# Patient Record
Sex: Female | Born: 1950 | Race: White | Hispanic: No | Marital: Married | Smoking: Never smoker
Health system: Southern US, Community
[De-identification: ages and names within clinical notes are randomized; demographics above are authoritative.]

## PROBLEM LIST (undated history)

## (undated) DIAGNOSIS — E119 Type 2 diabetes mellitus without complications: Secondary | ICD-10-CM

## (undated) DIAGNOSIS — M8580 Other specified disorders of bone density and structure, unspecified site: Secondary | ICD-10-CM

## (undated) DIAGNOSIS — M353 Polymyalgia rheumatica: Secondary | ICD-10-CM

## (undated) DIAGNOSIS — E7800 Pure hypercholesterolemia, unspecified: Secondary | ICD-10-CM

## (undated) DIAGNOSIS — I00 Rheumatic fever without heart involvement: Secondary | ICD-10-CM

## (undated) DIAGNOSIS — M1990 Unspecified osteoarthritis, unspecified site: Secondary | ICD-10-CM

## (undated) HISTORY — PX: ABDOMINAL HYSTERECTOMY: SHX81

## (undated) HISTORY — PX: ARTHROSCOPY KNEE W/ DRILLING: SUR92

---

## 2004-07-21 ENCOUNTER — Ambulatory Visit: Payer: Self-pay | Admitting: Obstetrics and Gynecology

## 2005-08-24 ENCOUNTER — Ambulatory Visit: Payer: Self-pay | Admitting: Obstetrics and Gynecology

## 2006-08-27 ENCOUNTER — Ambulatory Visit: Payer: Self-pay | Admitting: Obstetrics and Gynecology

## 2007-08-12 ENCOUNTER — Ambulatory Visit: Payer: Self-pay | Admitting: Family Medicine

## 2007-10-15 ENCOUNTER — Ambulatory Visit: Payer: Self-pay | Admitting: Obstetrics and Gynecology

## 2008-10-15 ENCOUNTER — Ambulatory Visit: Payer: Self-pay | Admitting: Obstetrics and Gynecology

## 2009-03-22 ENCOUNTER — Ambulatory Visit: Payer: Self-pay | Admitting: Gastroenterology

## 2009-10-19 ENCOUNTER — Ambulatory Visit: Payer: Self-pay | Admitting: Obstetrics and Gynecology

## 2010-10-27 ENCOUNTER — Ambulatory Visit: Payer: Self-pay | Admitting: Obstetrics and Gynecology

## 2011-11-02 ENCOUNTER — Ambulatory Visit: Payer: Self-pay | Admitting: Obstetrics and Gynecology

## 2012-02-06 ENCOUNTER — Emergency Department: Payer: Self-pay | Admitting: Emergency Medicine

## 2012-02-06 LAB — URINALYSIS, COMPLETE
Bacteria: NONE SEEN
Bilirubin,UR: NEGATIVE
Ph: 5 (ref 4.5–8.0)
Protein: NEGATIVE
RBC,UR: <1 /HPF (ref 0–5)
Specific Gravity: 1.01 (ref 1.003–1.030)
Squamous Epithelial: 1

## 2012-02-06 LAB — CBC
MCHC: 31.5 g/dL — ABNORMAL LOW (ref 32.0–36.0)
Platelet: 236 10*3/uL (ref 150–440)
RBC: 3.98 10*6/uL (ref 3.80–5.20)
RDW: 12 % (ref 11.5–14.5)
WBC: 8.2 10*3/uL (ref 3.6–11.0)

## 2012-02-06 LAB — COMPREHENSIVE METABOLIC PANEL
Albumin: 3.2 g/dL — ABNORMAL LOW (ref 3.4–5.0)
Alkaline Phosphatase: 72 U/L (ref 50–136)
Anion Gap: 9 (ref 7–16)
BUN: 18 mg/dL (ref 7–18)
Bilirubin,Total: 0.3 mg/dL (ref 0.2–1.0)
Calcium, Total: 9.2 mg/dL (ref 8.5–10.1)
Chloride: 105 mmol/L (ref 98–107)
Glucose: 196 mg/dL — ABNORMAL HIGH (ref 65–99)
Osmolality: 287 (ref 275–301)
Potassium: 4.1 mmol/L (ref 3.5–5.1)
SGOT(AST): 19 U/L (ref 15–37)
SGPT (ALT): 27 U/L
Total Protein: 6.9 g/dL (ref 6.4–8.2)

## 2012-02-09 LAB — URINE CULTURE

## 2012-11-14 ENCOUNTER — Ambulatory Visit: Payer: Self-pay | Admitting: Obstetrics and Gynecology

## 2013-12-02 ENCOUNTER — Ambulatory Visit: Payer: Self-pay | Admitting: Obstetrics and Gynecology

## 2014-11-24 ENCOUNTER — Other Ambulatory Visit: Payer: Self-pay | Admitting: Obstetrics and Gynecology

## 2014-11-24 DIAGNOSIS — Z1231 Encounter for screening mammogram for malignant neoplasm of breast: Secondary | ICD-10-CM

## 2014-12-11 ENCOUNTER — Ambulatory Visit
Admission: RE | Admit: 2014-12-11 | Discharge: 2014-12-11 | Disposition: A | Discharge status: Home / Self Care | Payer: BC Managed Care – PPO | Source: Ambulatory Visit | Attending: Obstetrics and Gynecology | Admitting: Obstetrics and Gynecology

## 2014-12-11 ENCOUNTER — Other Ambulatory Visit: Payer: Self-pay | Admitting: Obstetrics and Gynecology

## 2014-12-11 DIAGNOSIS — Z1231 Encounter for screening mammogram for malignant neoplasm of breast: Secondary | ICD-10-CM | POA: Diagnosis present

## 2015-11-23 ENCOUNTER — Other Ambulatory Visit: Payer: Self-pay | Admitting: Obstetrics and Gynecology

## 2015-11-23 DIAGNOSIS — Z1231 Encounter for screening mammogram for malignant neoplasm of breast: Secondary | ICD-10-CM

## 2015-12-13 ENCOUNTER — Other Ambulatory Visit: Payer: Self-pay | Admitting: Obstetrics and Gynecology

## 2015-12-13 ENCOUNTER — Ambulatory Visit
Admission: RE | Admit: 2015-12-13 | Discharge: 2015-12-13 | Disposition: A | Discharge status: Home / Self Care | Payer: BC Managed Care – PPO | Source: Ambulatory Visit | Attending: Obstetrics and Gynecology | Admitting: Obstetrics and Gynecology

## 2015-12-13 DIAGNOSIS — Z1231 Encounter for screening mammogram for malignant neoplasm of breast: Secondary | ICD-10-CM

## 2016-03-01 ENCOUNTER — Encounter: Admission: RE | Payer: Self-pay | Source: Ambulatory Visit

## 2016-03-01 ENCOUNTER — Ambulatory Visit
Admission: RE | Admit: 2016-03-01 | Payer: BC Managed Care – PPO | Source: Ambulatory Visit | Admitting: Unknown Physician Specialty

## 2016-03-01 SURGERY — COLONOSCOPY WITH PROPOFOL
Anesthesia: General

## 2016-04-21 ENCOUNTER — Encounter: Payer: Self-pay | Admitting: *Deleted

## 2016-04-24 ENCOUNTER — Ambulatory Visit: Payer: BC Managed Care – PPO | Admitting: Anesthesiology

## 2016-04-24 ENCOUNTER — Encounter
Admission: RE | Disposition: A | Discharge status: Home / Self Care | Payer: Self-pay | Source: Ambulatory Visit | Attending: Unknown Physician Specialty

## 2016-04-24 ENCOUNTER — Encounter: Payer: Self-pay | Admitting: *Deleted

## 2016-04-24 ENCOUNTER — Ambulatory Visit: Admit: 2016-04-24 | Payer: Medicare Other | Admitting: Unknown Physician Specialty

## 2016-04-24 ENCOUNTER — Ambulatory Visit
Admission: RE | Admit: 2016-04-24 | Discharge: 2016-04-24 | Disposition: A | Discharge status: Home / Self Care | Payer: BC Managed Care – PPO | Source: Ambulatory Visit | Attending: Unknown Physician Specialty | Admitting: Unknown Physician Specialty

## 2016-04-24 DIAGNOSIS — M1990 Unspecified osteoarthritis, unspecified site: Secondary | ICD-10-CM | POA: Insufficient documentation

## 2016-04-24 DIAGNOSIS — Z1211 Encounter for screening for malignant neoplasm of colon: Secondary | ICD-10-CM | POA: Diagnosis present

## 2016-04-24 DIAGNOSIS — Z7982 Long term (current) use of aspirin: Secondary | ICD-10-CM | POA: Diagnosis not present

## 2016-04-24 DIAGNOSIS — Z79899 Other long term (current) drug therapy: Secondary | ICD-10-CM | POA: Insufficient documentation

## 2016-04-24 DIAGNOSIS — Z7984 Long term (current) use of oral hypoglycemic drugs: Secondary | ICD-10-CM | POA: Diagnosis not present

## 2016-04-24 DIAGNOSIS — K640 First degree hemorrhoids: Secondary | ICD-10-CM | POA: Insufficient documentation

## 2016-04-24 DIAGNOSIS — E119 Type 2 diabetes mellitus without complications: Secondary | ICD-10-CM | POA: Insufficient documentation

## 2016-04-24 DIAGNOSIS — Z79890 Hormone replacement therapy (postmenopausal): Secondary | ICD-10-CM | POA: Diagnosis not present

## 2016-04-24 HISTORY — DX: Other specified disorders of bone density and structure, unspecified site: M85.80

## 2016-04-24 HISTORY — DX: Rheumatic fever without heart involvement: I00

## 2016-04-24 HISTORY — DX: Polymyalgia rheumatica: M35.3

## 2016-04-24 HISTORY — DX: Unspecified osteoarthritis, unspecified site: M19.90

## 2016-04-24 HISTORY — PX: COLONOSCOPY WITH PROPOFOL: SHX5780

## 2016-04-24 HISTORY — DX: Pure hypercholesterolemia, unspecified: E78.00

## 2016-04-24 HISTORY — DX: Type 2 diabetes mellitus without complications: E11.9

## 2016-04-24 LAB — GLUCOSE, CAPILLARY
GLUCOSE-CAPILLARY: 75 mg/dL (ref 65–99)
Glucose-Capillary: 75 mg/dL (ref 65–99)

## 2016-04-24 SURGERY — COLONOSCOPY WITH PROPOFOL
Anesthesia: General

## 2016-04-24 MED ORDER — SODIUM CHLORIDE 0.9 % IV SOLN: INTRAVENOUS | Status: DC

## 2016-04-24 MED ORDER — POLYETHYLENE GLYCOL 3350 17 GM/SCOOP PO POWD
1.0000 | Freq: Once | ORAL | Status: AC
  Administered 2016-04-24: 255 g via ORAL
  Filled 2016-04-24: qty 255

## 2016-04-24 MED ORDER — PROPOFOL 500 MG/50ML IV EMUL
INTRAVENOUS | Status: DC | PRN
  Administered 2016-04-24: 150 ug/kg/min via INTRAVENOUS

## 2016-04-24 MED ORDER — PHENYLEPHRINE HCL 10 MG/ML IJ SOLN
INTRAMUSCULAR | Status: DC | PRN
  Administered 2016-04-24: 100 ug via INTRAVENOUS

## 2016-04-24 MED ORDER — PROPOFOL 10 MG/ML IV BOLUS
INTRAVENOUS | Status: DC | PRN
  Administered 2016-04-24 (×2): 20 mg via INTRAVENOUS

## 2016-04-24 MED ORDER — EPHEDRINE SULFATE-NACL 50-0.9 MG/10ML-% IV SOSY
PREFILLED_SYRINGE | INTRAVENOUS | Status: DC | PRN
  Administered 2016-04-24: 10 mg via INTRAVENOUS

## 2016-04-24 MED ORDER — PROPOFOL 10 MG/ML IV BOLUS
INTRAVENOUS | Status: DC | PRN
  Administered 2016-04-24: 90 mg via INTRAVENOUS

## 2016-04-24 MED ORDER — PROPOFOL 500 MG/50ML IV EMUL
INTRAVENOUS | Status: DC | PRN
  Administered 2016-04-24: 100 ug/kg/min via INTRAVENOUS

## 2016-04-24 MED ORDER — DEXTROSE 5 % IV SOLN
INTRAVENOUS | Status: DC
  Administered 2016-04-24 (×3): via INTRAVENOUS

## 2016-04-24 MED ORDER — SODIUM CHLORIDE 0.9 % IV SOLN
INTRAVENOUS | Status: DC
  Administered 2016-04-24: 1000 mL via INTRAVENOUS

## 2016-04-24 NOTE — Op Note (Signed)
[facility] Gastroenterology Patient Name: Ann Castro Procedure Date: 04/24/2016 7:35 AM MRN: [id number] Account #: [id number] Date of Birth: [birth year] Admit Type: Outpatient Age: 65 Room: [facility] ENDO ROOM 4 Gender: Female Note Status: Finalized Procedure:            Colonoscopy Indications:          Screening for colorectal malignant neoplasm Providers:            Scot Jun, MD Referring MD:         Teena Irani. Terance Hart, MD (Referring MD) Medicines:            Propofol per Anesthesia Complications:        No immediate complications. Procedure:            Pre-Anesthesia Assessment:                       - After reviewing the risks and benefits, the patient                        was deemed in satisfactory condition to undergo the                        procedure.                       After obtaining informed consent, the colonoscope was                        passed under direct vision. Throughout the procedure,                        the patient's blood pressure, pulse, and oxygen                        saturations were monitored continuously. The                        Colonoscope was introduced through the anus with the                        intention of advancing to the cecum. The scope was                        advanced to the descending colon before the procedure                        was aborted. Medications were given. The colonoscopy                        was performed with difficulty due to inadequate bowel                        prep. The patient tolerated the procedure well. The                        quality of the bowel preparation was unsatisfactory. Findings:      A large amount of semi-liquid stool was found in the rectum and in the       sigmoid colon, precluding visualization.      Internal hemorrhoids were found during endoscopy. The hemorrhoids  were       small and Grade I (internal hemorrhoids that do not prolapse).      The  exam was otherwise without abnormality. Impression:           - Preparation of the colon was unsatisfactory.                       - Stool in the rectum and in the sigmoid colon.                       - Internal hemorrhoids.                       - The examination was otherwise normal.                       - No specimens collected. Recommendation:       Possible more prep today or reschedule to a later date.                       - The findings and recommendations were discussed with                        the patient's family. Scot Junobert T Akshaj Besancon, MD 04/24/2016 7:50:58 AM This report has been signed electronically. Number of Addenda: 0 Note Initiated On: 04/24/2016 7:35 AM Total Procedure Duration: 0 hours 6 minutes 26 seconds       [facility]

## 2016-04-24 NOTE — H&P (Signed)
Primary Care Physician:  Dorothey Baseman, MD Primary Gastroenterologist:  Dr. Mechele Collin  Pre-Procedure History & Physical: HPI:  Ann Castro is a 65 y.o. female is here for an colonoscopy.   Past Medical History:  Diagnosis Date  . Arthritis   . Diabetes mellitus without complication (HCC)   . Elevated cholesterol   . Osteopenia   . Polymyalgia rheumatica (HCC)   . Rheumatic fever     Past Surgical History:  Procedure Laterality Date  . ABDOMINAL HYSTERECTOMY    . ARTHROSCOPY KNEE W/ DRILLING Left     Prior to Admission medications   Medication Sig Start Date End Date Taking? Authorizing Provider  amLODipine (NORVASC) 5 MG tablet Take 5 mg by mouth daily.   Yes Historical Provider, MD  aspirin EC 81 MG tablet Take 81 mg by mouth daily.   Yes Historical Provider, MD  chlorthalidone (HYGROTON) 25 MG tablet Take 25 mg by mouth daily.   Yes Historical Provider, MD  estrogens, conjugated, (PREMARIN) 0.3 MG tablet Take 0.3 mg by mouth daily. Take daily for 21 days then do not take for 7 days.   Yes Historical Provider, MD  glucose blood test strip 2 each by Other route as needed for other. Use as instructed   Yes Historical Provider, MD  Lancet Devices (LANCING DEVICE) MISC 1 Device by Does not apply route 2 (two) times daily.   Yes Historical Provider, MD  latanoprost (XALATAN) 0.005 % ophthalmic solution 1 drop at bedtime.   Yes Historical Provider, MD  metFORMIN (GLUCOPHAGE-XR) 750 MG 24 hr tablet Take 750 mg by mouth daily with breakfast.   Yes Historical Provider, MD  potassium chloride SA (K-DUR,KLOR-CON) 20 MEQ tablet Take 20 mEq by mouth 2 (two) times daily.   Yes Historical Provider, MD  solifenacin (VESICARE) 10 MG tablet Take 10 mg by mouth daily.   Yes Historical Provider, MD  timolol (TIMOPTIC) 0.5 % ophthalmic solution 1 drop 2 (two) times daily.   Yes Historical Provider, MD    Allergies as of 03/03/2016  . (Not on File)    Family History  Problem Relation Age  of Onset  . Breast cancer Maternal Aunt 69    Social History   Social History  . Marital status: Married    Spouse name: N/A  . Number of children: N/A  . Years of education: N/A   Occupational History  . Not on file.   Social History Main Topics  . Smoking status: Never Smoker  . Smokeless tobacco: Never Used  . Alcohol use Yes     Comment: occasional  . Drug use: No  . Sexual activity: Not on file   Other Topics Concern  . Not on file   Social History Narrative  . No narrative on file    Review of Systems: See HPI, otherwise negative ROS  Physical Exam: BP 98/63   Pulse 76   Temp 97.4 F (36.3 C) (Tympanic)   Resp 13   Ht 5\' 2"  (1.575 m)   Wt 49.9 kg (110 lb)   SpO2 100%   BMI 20.12 kg/m  General:   Alert,  pleasant and cooperative in NAD Head:  Normocephalic and atraumatic. Neck:  Supple; no masses or thyromegaly. Lungs:  Clear throughout to auscultation.    Heart:  Regular rate and rhythm. Abdomen:  Soft, nontender and nondistended. Normal bowel sounds, without guarding, and without rebound.   Neurologic:  Alert and  oriented x4;  grossly normal neurologically.  Impression/Plan: Ann Castro is here for an colonoscopy to be performed for screening colonoscopy  Risks, benefits, limitations, and alternatives regarding  colonoscopy have been reviewed with the patient.  Questions have been answered.  All parties agreeable.   Lynnae PrudeELLIOTT, Encarnacion Scioneaux, MD  04/24/2016, 8:28 AM

## 2016-04-24 NOTE — Anesthesia Preprocedure Evaluation (Signed)
Anesthesia Evaluation  Patient identified by MRN, date of birth, ID band Patient awake    Reviewed: Allergy & Precautions, H&P , NPO status , Patient's Chart, lab work & pertinent test results, reviewed documented beta blocker date and time   Airway Mallampati: III   Neck ROM: full    Dental  (+) Poor Dentition, Teeth Intact   Pulmonary neg pulmonary ROS,    Pulmonary exam normal        Cardiovascular negative cardio ROS Normal cardiovascular exam Rhythm:regular Rate:Normal     Neuro/Psych negative neurological ROS  negative psych ROS   GI/Hepatic negative GI ROS, Neg liver ROS,   Endo/Other  negative endocrine ROSdiabetes  Renal/GU negative Renal ROS  negative genitourinary   Musculoskeletal   Abdominal   Peds  Hematology negative hematology ROS (+)   Anesthesia Other Findings Past Medical History: No date: Arthritis No date: Diabetes mellitus without complication (HCC) No date: Elevated cholesterol No date: Osteopenia No date: Polymyalgia rheumatica (HCC) No date: Rheumatic fever Past Surgical History: No date: ABDOMINAL HYSTERECTOMY No date: ARTHROSCOPY KNEE W/ DRILLING Left   Reproductive/Obstetrics                             Anesthesia Physical Anesthesia Plan  ASA: III  Anesthesia Plan: General   Post-op Pain Management:    Induction:   Airway Management Planned:   Additional Equipment:   Intra-op Plan:   Post-operative Plan:   Informed Consent: I have reviewed the patients History and Physical, chart, labs and discussed the procedure including the risks, benefits and alternatives for the proposed anesthesia with the patient or authorized representative who has indicated his/her understanding and acceptance.   Dental Advisory Given  Plan Discussed with: CRNA  Anesthesia Plan Comments:         Anesthesia Quick Evaluation

## 2016-04-24 NOTE — OR Nursing (Signed)
Patient admitted to postop room 12 per stretcher. See vital sign flow sheet.  Dr.Elliott ordered an additional prep to further cleanout the patient for repeat colonoscopy later today.  Patient to remain in postop until continuation of bowel prep.

## 2016-04-24 NOTE — OR Nursing (Signed)
Patient has completed prep.  Dr. Mechele CollinElliott in to see patient. Patient up to restroom prn.  Tolerated prep well. Stool reusults is liquid but still dark in color.

## 2016-04-24 NOTE — Op Note (Signed)
[facility] Gastroenterology Patient Name: Ann Castro Procedure Date: 04/24/2016 4:44 PM MRN: [id number] Account #: [id number] Date of Birth: 22-May-1951 Admit Type: Outpatient Age: 66 Room: [facility] ENDO ROOM 4 Gender: Female Note Status: Finalized Procedure:            Colonoscopy Indications:          Screening for colorectal malignant neoplasm Providers:            Scot Jun, MD Referring MD:         Teena Irani. Terance Hart, MD (Referring MD) Medicines:            Propofol per Anesthesia Complications:        No immediate complications. Procedure:            Pre-Anesthesia Assessment:                       - After reviewing the risks and benefits, the patient                        was deemed in satisfactory condition to undergo the                        procedure.                       After obtaining informed consent, the colonoscope was                        passed under direct vision. Throughout the procedure,                        the patient's blood pressure, pulse, and oxygen                        saturations were monitored continuously. The                        Colonoscope was introduced through the anus and                        advanced to the the cecum, identified by appendiceal                        orifice and ileocecal valve. The colonoscopy was                        performed without difficulty. The patient tolerated the                        procedure well. The quality of the bowel preparation                        was good. Findings:      Internal hemorrhoids were found during endoscopy. The hemorrhoids were       small and Grade I (internal hemorrhoids that do not prolapse).      The exam was otherwise without abnormality. The prep was much better       after the 2L extra bottle of Miralax and Gatorade. Impression:           - Internal hemorrhoids.                       -  The examination was otherwise normal.     - No specimens collected. Recommendation:       - Repeat colonoscopy in 10 years for screening purposes. Scot Junobert T Jerrica Thorman, MD 04/24/2016 5:24:31 PM This report has been signed electronically. Number of Addenda: 0 Note Initiated On: 04/24/2016 4:44 PM Scope Withdrawal Time: 0 hours 13 minutes 49 seconds  Total Procedure Duration: 0 hours 24 minutes 57 seconds       [facility]

## 2016-04-24 NOTE — Transfer of Care (Signed)
Immediate Anesthesia Transfer of Care Note  Patient: Ann Castro  Procedure(s) Performed: Procedure(s): COLONOSCOPY WITH PROPOFOL (N/A)  Patient Location: PACU and Endoscopy Unit  Anesthesia Type:General  Level of Consciousness: awake  Airway & Oxygen Therapy: Patient Spontanous Breathing  Post-op Assessment: Report given to RN  Post vital signs: stable  Last Vitals:  Vitals:   04/24/16 0704  BP: 114/66  Pulse: 93  Resp: 16  Temp: 36.2 C    Last Pain:  Vitals:   04/24/16 0704  TempSrc: Tympanic         Complications: No apparent anesthesia complications

## 2016-04-24 NOTE — Transfer of Care (Signed)
Immediate Anesthesia Transfer of Care Note  Patient: Reinaldo BerberBetsy E Castro  Procedure(s) Performed: Procedure(s): COLONOSCOPY WITH PROPOFOL (N/A)  Patient Location: PACU and Endoscopy Unit  Anesthesia Type:General  Level of Consciousness: patient cooperative and lethargic  Airway & Oxygen Therapy: Patient Spontanous Breathing and Patient connected to nasal cannula oxygen  Post-op Assessment: Report given to RN and Post -op Vital signs reviewed and stable  Post vital signs: Reviewed and stable  Last Vitals:  Vitals:   04/24/16 0815 04/24/16 1725  BP: 98/63 97/66  Pulse: 76 76  Resp: 13   Temp:      Last Pain:  Vitals:   04/24/16 0755  TempSrc: Tympanic  PainSc: Asleep         Complications: No apparent anesthesia complications

## 2016-04-24 NOTE — Anesthesia Preprocedure Evaluation (Signed)
Anesthesia Evaluation  Patient identified by MRN, date of birth, ID band Patient awake    Reviewed: Allergy & Precautions, H&P , NPO status , Patient's Chart, lab work & pertinent test results, reviewed documented beta blocker date and time   Airway Mallampati: III   Neck ROM: full    Dental  (+) Poor Dentition, Teeth Intact   Pulmonary neg pulmonary ROS,    Pulmonary exam normal        Cardiovascular negative cardio ROS Normal cardiovascular exam Rhythm:regular Rate:Normal     Neuro/Psych negative neurological ROS  negative psych ROS   GI/Hepatic negative GI ROS, Neg liver ROS,   Endo/Other  negative endocrine ROSdiabetes  Renal/GU negative Renal ROS  negative genitourinary   Musculoskeletal   Abdominal   Peds  Hematology negative hematology ROS (+)   Anesthesia Other Findings Past Medical History: No date: Arthritis No date: Diabetes mellitus without complication (HCC) No date: Elevated cholesterol No date: Osteopenia No date: Polymyalgia rheumatica (HCC) No date: Rheumatic fever Past Surgical History: No date: ABDOMINAL HYSTERECTOMY No date: ARTHROSCOPY KNEE W/ DRILLING Left   Reproductive/Obstetrics                             Anesthesia Physical Anesthesia Plan  ASA: III  Anesthesia Plan: General   Post-op Pain Management:    Induction:   Airway Management Planned:   Additional Equipment:   Intra-op Plan:   Post-operative Plan:   Informed Consent: I have reviewed the patients History and Physical, chart, labs and discussed the procedure including the risks, benefits and alternatives for the proposed anesthesia with the patient or authorized representative who has indicated his/her understanding and acceptance.   Dental Advisory Given  Plan Discussed with: CRNA  Anesthesia Plan Comments:         Anesthesia Quick Evaluation  

## 2016-04-24 NOTE — Anesthesia Postprocedure Evaluation (Signed)
Anesthesia Post Note  Patient: Reinaldo BerberBetsy E Volland  Procedure(s) Performed: Procedure(s) (LRB): COLONOSCOPY WITH PROPOFOL (N/A)  Patient location during evaluation: PACU Anesthesia Type: General Level of consciousness: awake and alert Pain management: pain level controlled Vital Signs Assessment: post-procedure vital signs reviewed and stable Respiratory status: spontaneous breathing, nonlabored ventilation, respiratory function stable and patient connected to nasal cannula oxygen Cardiovascular status: blood pressure returned to baseline and stable Postop Assessment: no signs of nausea or vomiting Anesthetic complications: no    Last Vitals:  Vitals:   04/24/16 0805 04/24/16 0815  BP: (!) 96/58 98/63  Pulse: 73 76  Resp: 14 13  Temp:      Last Pain:  Vitals:   04/24/16 0755  TempSrc: Tympanic  PainSc: Asleep                 Yevette EdwardsJames G Sameera Betton

## 2016-04-25 ENCOUNTER — Encounter: Payer: Self-pay | Admitting: Unknown Physician Specialty

## 2016-04-25 IMAGING — MG MM SCREENING BREAST TOMO BILATERAL
9 of 12 series · 9 of 28 positions shown · non-contrast
Comparison: Previous exam(s).

CLINICAL DATA: Screening.

EXAM:
DIGITAL SCREENING BILATERAL MAMMOGRAM WITH 3D TOMO WITH CAD

[R MLO synth-2D]
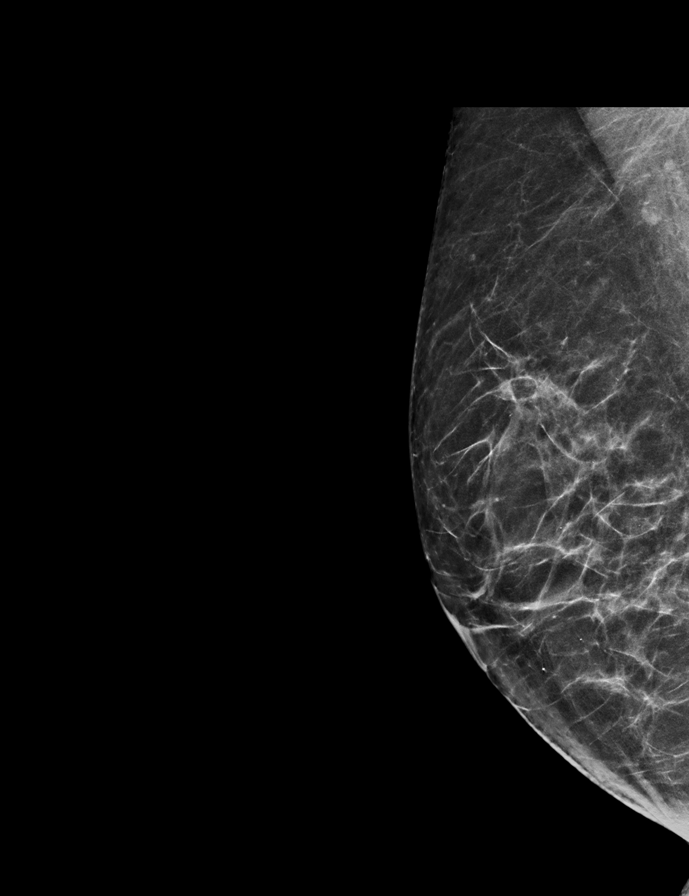

[L MLO synth-2D]
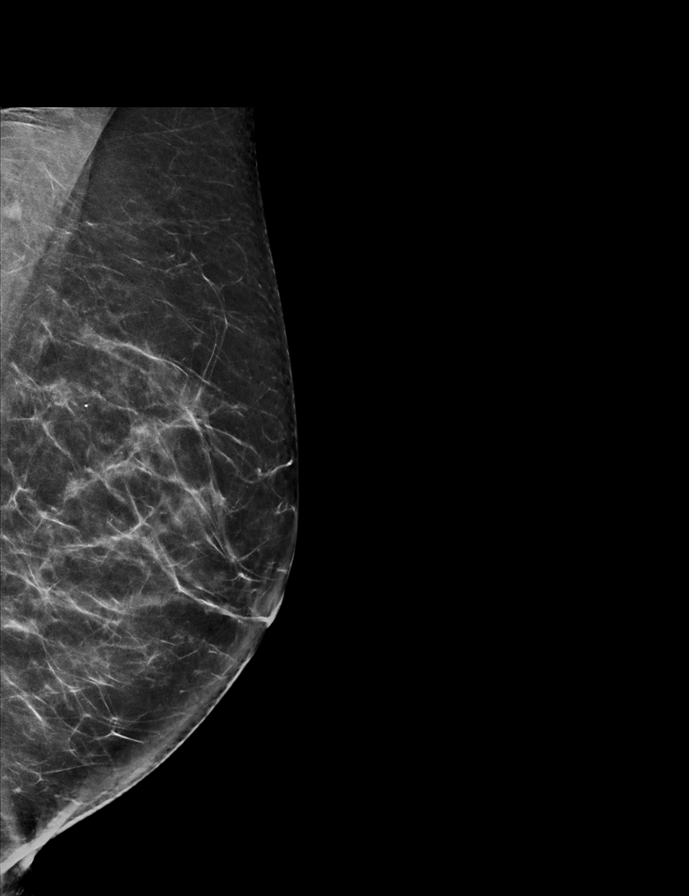

[L CC]
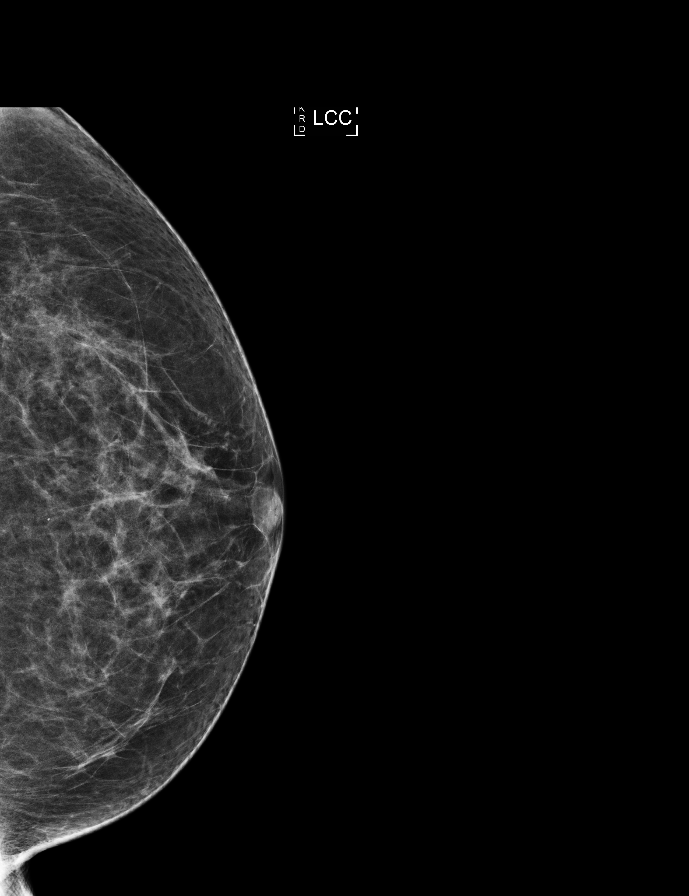

[R CC]
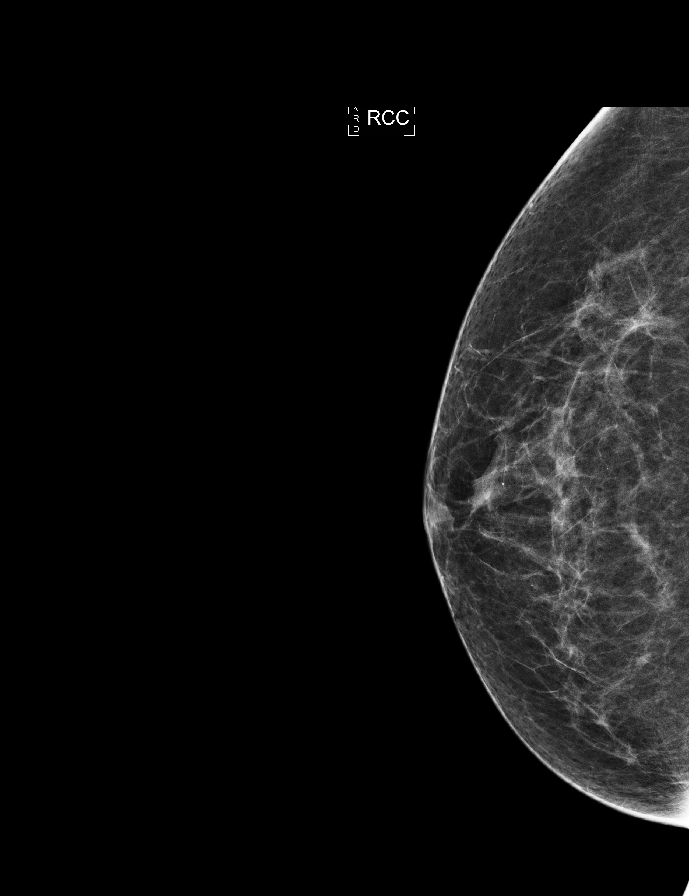

[R CC synth-2D]
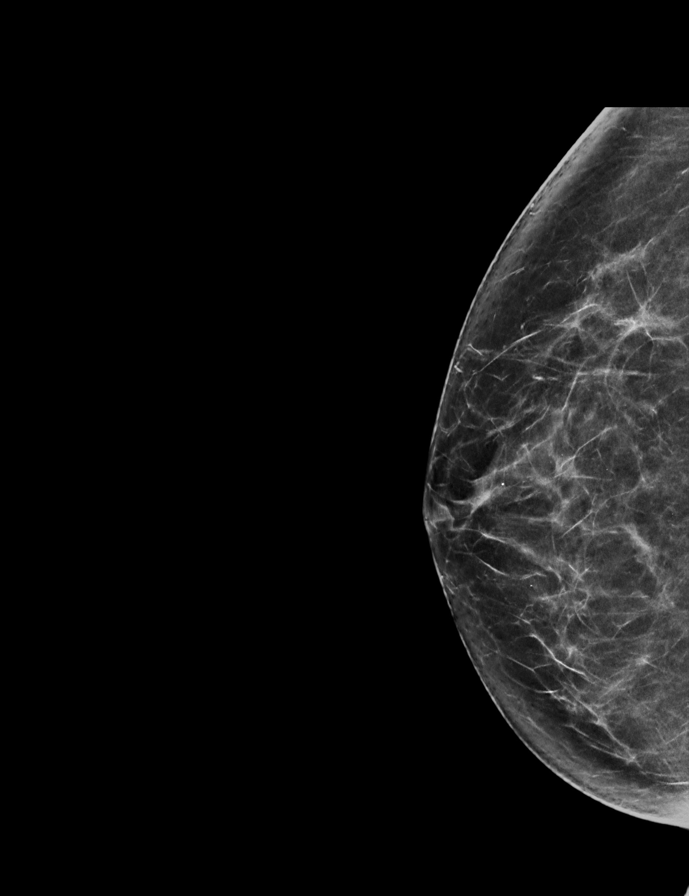

[L CC synth-2D]
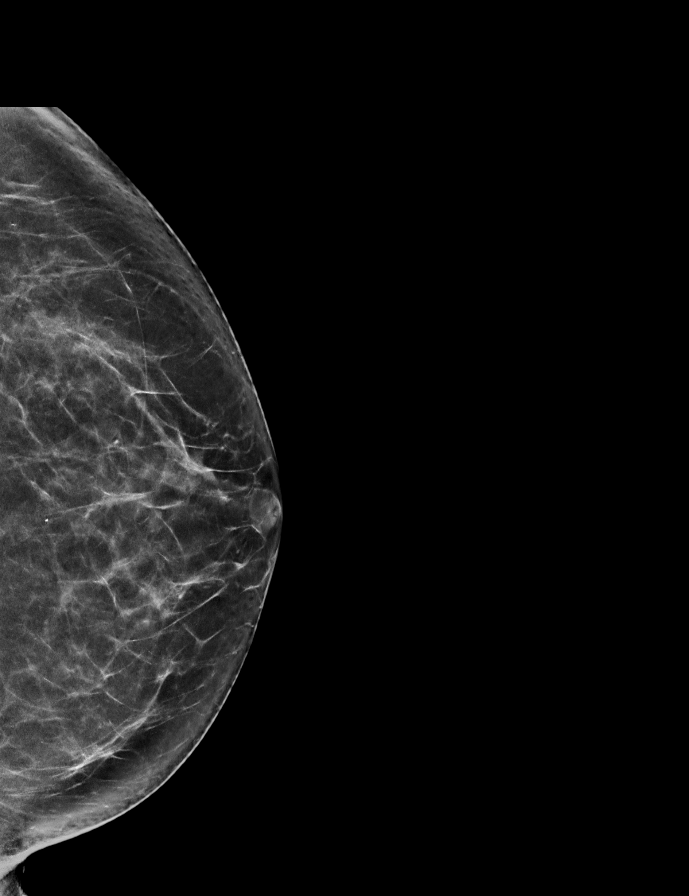

[R MLO]
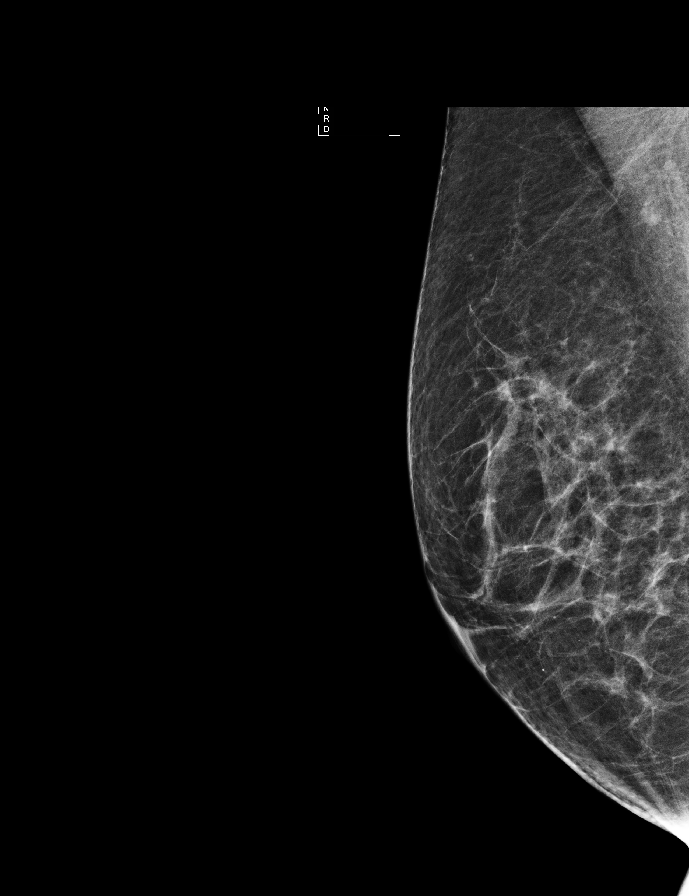

[L MLO]
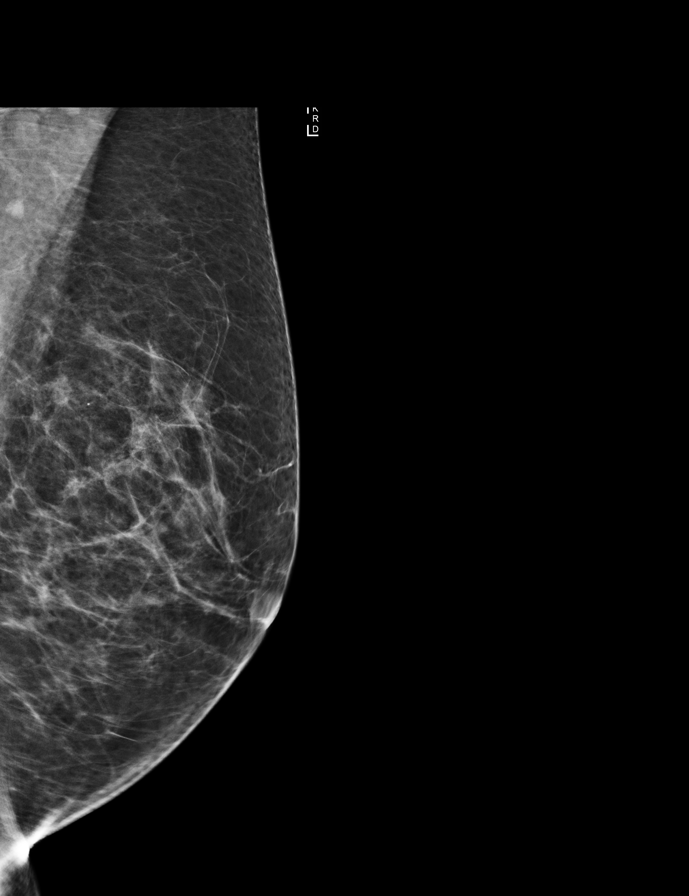

[R CC tomo · tomo slice 37/73.0]
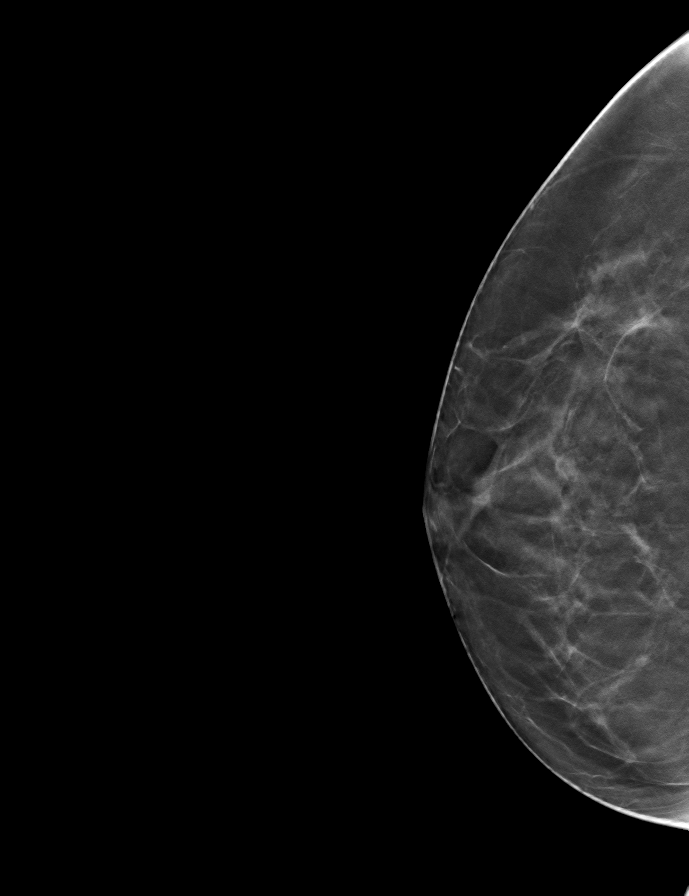

[9 of 28 positions shown; findings below may reference images not displayed]

ACR Breast Density Category b: There are scattered areas of
fibroglandular density.
FINDINGS: There are no findings suspicious for malignancy. Images were
processed with CAD.
IMPRESSION: No mammographic evidence of malignancy. A result letter of this
screening mammogram will be mailed directly to the patient.

RECOMMENDATION:
Screening mammogram in one year. (Code:55-L-23V)

BI-RADS CATEGORY  1: Negative.

## 2016-04-25 NOTE — Anesthesia Postprocedure Evaluation (Signed)
Anesthesia Post Note  Patient: Ann Castro  Procedure(s) Performed: Procedure(s) (LRB): COLONOSCOPY WITH PROPOFOL (N/A)  Patient location during evaluation: PACU Anesthesia Type: General Level of consciousness: awake and alert and oriented Pain management: pain level controlled Vital Signs Assessment: post-procedure vital signs reviewed and stable Respiratory status: spontaneous breathing Cardiovascular status: blood pressure returned to baseline Anesthetic complications: no    Last Vitals:  Vitals:   04/24/16 1745 04/24/16 1755  BP: 105/70 99/69  Pulse:    Resp:    Temp:      Last Pain:  Vitals:   04/24/16 1725  TempSrc: Tympanic  PainSc:                  Priti Consoli

## 2016-12-13 ENCOUNTER — Other Ambulatory Visit: Payer: Self-pay | Admitting: Obstetrics and Gynecology

## 2016-12-13 DIAGNOSIS — Z1231 Encounter for screening mammogram for malignant neoplasm of breast: Secondary | ICD-10-CM

## 2017-01-05 ENCOUNTER — Ambulatory Visit
Admission: RE | Admit: 2017-01-05 | Discharge: 2017-01-05 | Disposition: A | Discharge status: Home / Self Care | Payer: BC Managed Care – PPO | Source: Ambulatory Visit | Attending: Obstetrics and Gynecology | Admitting: Obstetrics and Gynecology

## 2017-01-05 DIAGNOSIS — Z1231 Encounter for screening mammogram for malignant neoplasm of breast: Secondary | ICD-10-CM | POA: Diagnosis present

## 2017-12-25 ENCOUNTER — Other Ambulatory Visit: Payer: Self-pay | Admitting: Obstetrics and Gynecology

## 2017-12-25 DIAGNOSIS — Z1231 Encounter for screening mammogram for malignant neoplasm of breast: Secondary | ICD-10-CM

## 2018-01-11 ENCOUNTER — Ambulatory Visit
Admission: RE | Admit: 2018-01-11 | Discharge: 2018-01-11 | Disposition: A | Discharge status: Home / Self Care | Payer: Medicare Other | Source: Ambulatory Visit | Attending: Obstetrics and Gynecology | Admitting: Obstetrics and Gynecology

## 2018-01-11 DIAGNOSIS — Z1231 Encounter for screening mammogram for malignant neoplasm of breast: Secondary | ICD-10-CM | POA: Diagnosis present

## 2018-12-31 ENCOUNTER — Other Ambulatory Visit: Payer: Self-pay | Admitting: Obstetrics and Gynecology

## 2018-12-31 DIAGNOSIS — Z1231 Encounter for screening mammogram for malignant neoplasm of breast: Secondary | ICD-10-CM

## 2019-01-14 ENCOUNTER — Other Ambulatory Visit: Payer: Self-pay

## 2019-01-14 ENCOUNTER — Ambulatory Visit
Admission: RE | Admit: 2019-01-14 | Discharge: 2019-01-14 | Disposition: A | Discharge status: Home / Self Care | Payer: Medicare Other | Source: Ambulatory Visit | Attending: Obstetrics and Gynecology | Admitting: Obstetrics and Gynecology

## 2019-01-14 DIAGNOSIS — Z1231 Encounter for screening mammogram for malignant neoplasm of breast: Secondary | ICD-10-CM | POA: Insufficient documentation

## 2020-01-06 ENCOUNTER — Other Ambulatory Visit: Payer: Self-pay | Admitting: Obstetrics and Gynecology

## 2020-01-06 DIAGNOSIS — Z1231 Encounter for screening mammogram for malignant neoplasm of breast: Secondary | ICD-10-CM

## 2020-01-15 ENCOUNTER — Ambulatory Visit
Admission: RE | Admit: 2020-01-15 | Discharge: 2020-01-15 | Disposition: A | Discharge status: Home / Self Care | Payer: Medicare PPO | Source: Ambulatory Visit | Attending: Obstetrics and Gynecology | Admitting: Obstetrics and Gynecology

## 2020-01-15 DIAGNOSIS — Z1231 Encounter for screening mammogram for malignant neoplasm of breast: Secondary | ICD-10-CM | POA: Diagnosis present

## 2021-01-25 ENCOUNTER — Other Ambulatory Visit: Payer: Self-pay | Admitting: Obstetrics and Gynecology

## 2021-01-25 DIAGNOSIS — Z1231 Encounter for screening mammogram for malignant neoplasm of breast: Secondary | ICD-10-CM

## 2021-02-10 ENCOUNTER — Other Ambulatory Visit: Payer: Self-pay

## 2021-02-10 ENCOUNTER — Ambulatory Visit
Admission: RE | Admit: 2021-02-10 | Discharge: 2021-02-10 | Disposition: A | Discharge status: Home / Self Care | Payer: Medicare PPO | Source: Ambulatory Visit | Attending: Obstetrics and Gynecology | Admitting: Obstetrics and Gynecology

## 2021-02-10 DIAGNOSIS — Z1231 Encounter for screening mammogram for malignant neoplasm of breast: Secondary | ICD-10-CM | POA: Diagnosis not present

## 2022-02-01 ENCOUNTER — Other Ambulatory Visit: Payer: Self-pay | Admitting: Obstetrics and Gynecology

## 2022-02-01 DIAGNOSIS — Z1231 Encounter for screening mammogram for malignant neoplasm of breast: Secondary | ICD-10-CM

## 2022-02-22 ENCOUNTER — Ambulatory Visit
Admission: RE | Admit: 2022-02-22 | Discharge: 2022-02-22 | Disposition: A | Discharge status: Home / Self Care | Payer: Medicare PPO | Source: Ambulatory Visit | Attending: Obstetrics and Gynecology | Admitting: Obstetrics and Gynecology

## 2022-02-22 DIAGNOSIS — Z1231 Encounter for screening mammogram for malignant neoplasm of breast: Secondary | ICD-10-CM | POA: Diagnosis not present

## 2023-02-06 ENCOUNTER — Other Ambulatory Visit: Payer: Self-pay | Admitting: Obstetrics and Gynecology

## 2023-02-06 DIAGNOSIS — Z1231 Encounter for screening mammogram for malignant neoplasm of breast: Secondary | ICD-10-CM

## 2023-02-13 ENCOUNTER — Ambulatory Visit: Payer: Medicare PPO | Admitting: Physical Therapy

## 2023-02-20 ENCOUNTER — Encounter: Payer: Self-pay | Admitting: Physical Therapy

## 2023-02-20 ENCOUNTER — Other Ambulatory Visit: Payer: Self-pay

## 2023-02-20 ENCOUNTER — Ambulatory Visit: Payer: Medicare PPO | Attending: Family Medicine | Admitting: Physical Therapy

## 2023-02-20 VITALS — BP 141/67 | HR 63

## 2023-02-20 DIAGNOSIS — R2681 Unsteadiness on feet: Secondary | ICD-10-CM | POA: Insufficient documentation

## 2023-02-20 DIAGNOSIS — R2689 Other abnormalities of gait and mobility: Secondary | ICD-10-CM | POA: Diagnosis present

## 2023-02-20 DIAGNOSIS — M6281 Muscle weakness (generalized): Secondary | ICD-10-CM | POA: Insufficient documentation

## 2023-02-20 NOTE — Therapy (Signed)
OUTPATIENT PHYSICAL THERAPY NEURO EVALUATION   Patient Name: Ann Castro MRN: [id number] DOB:1950-09-03, 72 y.o., female Today's Date: 02/20/2023   PCP: Dorothey Baseman, MD REFERRING PROVIDER: Dorothey Baseman, MD  END OF SESSION:   02/20/23 1029  PT Visits / Re-Eval  Visit Number 1  Number of Visits 9 (8 + eval)  Date for PT Re-Evaluation 05/11/23 (pushed out due to scheduling delay)  Authorization  Authorization Type [employer]IC[employer]  PT Time Calculation  PT Start Time 1020  PT Stop Time 1104  PT Time Calculation (min) 44 min  PT - End of Session  Equipment Utilized During Treatment Gait belt  Behavior During Therapy WFL for tasks assessed/performed    Past Medical History:  Diagnosis Date   Arthritis    Diabetes mellitus without complication (HCC)    Elevated cholesterol    Osteopenia    Polymyalgia rheumatica (HCC)    Rheumatic fever    Past Surgical History:  Procedure Laterality Date   ABDOMINAL HYSTERECTOMY     ARTHROSCOPY KNEE W/ DRILLING Left    COLONOSCOPY WITH PROPOFOL N/A 04/24/2016   Procedure: COLONOSCOPY WITH PROPOFOL;  Surgeon: Scot Jun, MD;  Location: [facility] ENDOSCOPY;  Service: Endoscopy;  Laterality: N/A;   COLONOSCOPY WITH PROPOFOL N/A 04/24/2016   Procedure: COLONOSCOPY WITH PROPOFOL;  Surgeon: Scot Jun, MD;  Location: [facility] ENDOSCOPY;  Service: Endoscopy;  Laterality: N/A;   There are no problems to display for this patient.   ONSET DATE: several years ago  REFERRING DIAG: R26.89 (ICD-10-CM) - Other abnormalities of gait and mobility  THERAPY DIAG:  Other abnormalities of gait and mobility  Unsteadiness on feet  Muscle weakness (generalized)  Rationale for Evaluation and Treatment: Rehabilitation  SUBJECTIVE:                                                                                                                                                                                             SUBJECTIVE  STATEMENT: "I have 2 kinds of problems, one is balance.  I suspect this is partially due to my left leg previously being misaligned and the surgeon having to break a bone and shorten a muscle on the side of my leg.  I am unsure what was done to my right leg because I was only 17-47 years old.  I have had balance issues since then.  I am in a low impact aerobic exercise class.  The second problem is I think I am not picking up my right foot all the time.  That started happening 6-7 years ago.  This has caused a few falls."  Aerobics class  is 3x per week. Pt accompanied by: self - drove herself  PERTINENT HISTORY: 2 prior knee surgeries done at age 50 and 57 that patient states was for bilateral kneecap instability  PAIN:  Are you having pain? No  PRECAUTIONS: Fall  RED FLAGS: None   WEIGHT BEARING RESTRICTIONS: No  FALLS: Has patient fallen in last 6 months? Yes. Number of falls 1 - when she was dancing  LIVING ENVIRONMENT: Lives with: lives with their spouse and 1 small dog to care for Lives in: House/apartment Stairs: Yes: Internal: 15-16 steps; on left going up and External: 4 steps; can reach both Has following equipment at home: Shower bench, Grab bars, and Hurrycane  PLOF: Independent  PATIENT GOALS: "Make me pick my feet up."  OBJECTIVE:   DIAGNOSTIC FINDINGS: No recent relevant imaging.  COGNITION: Overall cognitive status: Within functional limits for tasks assessed   SENSATION: Light touch: WFL  COORDINATION: LE RAMS:  WNL Bilateral Heel-to-shin:  WNL  EDEMA:  None noted in BLE  MUSCLE TONE: None noted in BLE  POSTURE: No Significant postural limitations  LOWER EXTREMITY ROM:     Active  Right Eval Left Eval  Hip flexion WNL WNL  Hip extension    Hip abduction " "  Hip adduction    Hip internal rotation    Hip external rotation    Knee flexion " "  Knee extension " "  Ankle dorsiflexion " "  Ankle plantarflexion    Ankle inversion    Ankle  eversion     (Blank rows = not tested)  LOWER EXTREMITY MMT:    MMT Right Eval Left Eval  Hip flexion 4+/5 4+/5  Hip extension    Hip abduction 4+/5 4+/5  Hip adduction    Hip internal rotation    Hip external rotation    Knee flexion    Knee extension 5/5 5/5  Ankle dorsiflexion 4+/5 4+/5  Ankle plantarflexion    Ankle inversion    Ankle eversion    (Blank rows = not tested)  BED MOBILITY:  Sit to supine Complete Independence Supine to sit Complete Independence Rolling to Right Complete Independence Rolling to Left Complete Independence  TRANSFERS: Assistive device utilized: None  Sit to stand: Modified independence-uses hands intermittently to stand Stand to sit: Complete Independence Chair to chair: Complete Independence Floor: CGA and Min A - per report  GAIT: Gait pattern: step through pattern, decreased stride length, shuffling, decreased trunk rotation, and narrow BOS Distance walked: various clinic distances Assistive device utilized: None Level of assistance: SBA and CGA Comments: Significant bowing of the LLE contributing to narrowed BOS w/ foot positioned medially.  FUNCTIONAL TESTS:  5 times sit to stand: 11.72 seconds w/ BUE support, mild propulsion into standing, immediate standing balance is unsteady and inconsistent 10 meter walk test: 10.22 seconds no AD SBA = 0.98 m/sec OR 3.23 ft/sec Functional gait assessment: 19/30  [facility] PT Assessment - 02/20/23 1057       Functional Gait  Assessment   Gait assessed  Yes    Gait Level Surface Walks 20 ft in less than 7 sec but greater than 5.5 sec, uses assistive device, slower speed, mild gait deviations, or deviates 6-10 in outside of the 12 in walkway width.    Change in Gait Speed Able to change speed, demonstrates mild gait deviations, deviates 6-10 in outside of the 12 in walkway width, or no gait deviations, unable to achieve a major change in velocity, or uses a  change in velocity, or uses an assistive  device.    Gait with Horizontal Head Turns Performs head turns smoothly with no change in gait. Deviates no more than 6 in outside 12 in walkway width    Gait with Vertical Head Turns Performs task with slight change in gait velocity (eg, minor disruption to smooth gait path), deviates 6 - 10 in outside 12 in walkway width or uses assistive device    Gait and Pivot Turn Pivot turns safely in greater than 3 sec and stops with no loss of balance, or pivot turns safely within 3 sec and stops with mild imbalance, requires small steps to catch balance.    Step Over Obstacle Is able to step over one shoe box (4.5 in total height) without changing gait speed. No evidence of imbalance.    Gait with Narrow Base of Support Ambulates less than 4 steps heel to toe or cannot perform without assistance.    Gait with Eyes Closed Walks 20 ft, uses assistive device, slower speed, mild gait deviations, deviates 6-10 in outside 12 in walkway width. Ambulates 20 ft in less than 9 sec but greater than 7 sec.    Ambulating Backwards Walks 20 ft, uses assistive device, slower speed, mild gait deviations, deviates 6-10 in outside 12 in walkway width.    Steps Alternating feet, must use rail.    Total Score 19    FGA comment: 19/30 = moderate fall risk            PATIENT SURVEYS:  None completed due to time.  TODAY'S TREATMENT:                                                                                                                              DATE: N/A eval only   PATIENT EDUCATION: Education details: PT POC, assessments used and to be used, and goals to be set. Person educated: Patient Education method: Explanation Education comprehension: verbalized understanding  HOME EXERCISE PROGRAM: To be established.  GOALS: Goals reviewed with patient? Yes  SHORT TERM GOALS: Target date: 03/23/2023  Pt will be independent and compliant with initial strengthening and balance HEP in order to maintain  functional progress and improve mobility. Baseline:  Pt in low impact aerobics class, will provide HEP. Goal status: INITIAL  2.  Patient will demonstrate understanding of fall prevention education. Baseline: Handout to be provided. Goal status: INITIAL  LONG TERM GOALS: Target date: 04/20/2023 (update for remaining visits at time of assessment)  Pt will be independent and compliant with advanced initial strengthening and balance HEP in order to maintain functional progress and improve mobility. Baseline: To be established. Goal status: INITIAL  2.  Patient will demonstrate floor recovery with no more than SBA in order to improve safe mobility in the event of a fall in the home environment. Baseline:  CGA-MinA Goal status: INITIAL  3.  Patient will demonstrate 5xSTS assessment with consistent steady immediate standing  balance on each rep to demonstrate improved functional BLE strength and balance strategies. Baseline: 11.72 seconds w/ BUE support, mild propulsion into standing, immediate standing balance is unsteady and inconsistent Goal status: INITIAL  4.  Pt will demonstrate a gait speed of >/=3.43 feet/sec in order to decrease risk for falls. Baseline: 3.23 ft/sec Goal status: INITIAL  5.  Pt will improve FGA score to >/=23/30 in order to demonstrate improved balance and decreased fall risk. Baseline: 19/30 Goal status: INITIAL  ASSESSMENT:  CLINICAL IMPRESSION: Patient is a 72 y.o. female who was seen today for physical therapy evaluation and treatment for gait instability.  Pt has a significant PMH of bilateral knee surgeries for kneecap instability w/ LLE reconstruction.  Identified impairments include chronic bowing of the LLE, mild BLE weakness, narrowed BOS and shuffling gait, and difficulty performing fall recovery independently.  Evaluation via the following assessment tools: 5xSTS, , and FGA indicate elevated fall risk due to difficulty with immediate standing and  dynamic balance as well as slowed pace of gait.  She would benefit from skilled PT to address impairments as noted and progress towards long term goals.  OBJECTIVE IMPAIRMENTS: Abnormal gait, decreased activity tolerance, decreased balance, decreased knowledge of use of DME, difficulty walking, decreased strength, improper body mechanics, and postural dysfunction.   ACTIVITY LIMITATIONS: lifting, squatting, stairs, transfers, and locomotion level  PARTICIPATION LIMITATIONS: community activity and yard work  PERSONAL FACTORS: Age, Fitness, Past/current experiences, Time since onset of injury/illness/exacerbation, and 1 comorbidity: prior surgical changes  are also affecting patient's functional outcome.   REHAB POTENTIAL: Good  CLINICAL DECISION MAKING: Stable/uncomplicated  EVALUATION COMPLEXITY: Low  PLAN:  PT FREQUENCY: 1x/week  PT DURATION: 12 weeks (only scheduling 8 visits to start)  PLANNED INTERVENTIONS: Therapeutic exercises, Therapeutic activity, Neuromuscular re-education, Balance training, Gait training, Patient/Family education, Self Care, Joint mobilization, Stair training, Vestibular training, Orthotic/Fit training, DME instructions, Taping, Manual therapy, and Re-evaluation  PLAN FOR NEXT SESSION: Initiate HEP for static and dynamic balance and functional LE strength.  Provide fall prevention handout.  Floor recovery.   Sadie Haber, PT, DPT 02/20/2023, 11:06 AM

## 2023-03-01 ENCOUNTER — Ambulatory Visit
Admission: RE | Admit: 2023-03-01 | Discharge: 2023-03-01 | Disposition: A | Discharge status: Home / Self Care | Payer: Medicare PPO | Source: Ambulatory Visit | Attending: Obstetrics and Gynecology | Admitting: Obstetrics and Gynecology

## 2023-03-01 DIAGNOSIS — Z1231 Encounter for screening mammogram for malignant neoplasm of breast: Secondary | ICD-10-CM | POA: Insufficient documentation

## 2023-03-08 ENCOUNTER — Ambulatory Visit: Payer: Medicare PPO | Admitting: Physical Therapy

## 2023-03-20 ENCOUNTER — Ambulatory Visit: Payer: Medicare PPO | Attending: Family Medicine | Admitting: Physical Therapy

## 2023-03-20 ENCOUNTER — Encounter: Payer: Self-pay | Admitting: Physical Therapy

## 2023-03-20 DIAGNOSIS — M6281 Muscle weakness (generalized): Secondary | ICD-10-CM | POA: Insufficient documentation

## 2023-03-20 DIAGNOSIS — R2689 Other abnormalities of gait and mobility: Secondary | ICD-10-CM | POA: Diagnosis present

## 2023-03-20 DIAGNOSIS — R2681 Unsteadiness on feet: Secondary | ICD-10-CM | POA: Diagnosis present

## 2023-03-20 NOTE — Therapy (Unsigned)
OUTPATIENT PHYSICAL THERAPY NEURO TREATMENT   Patient Name: Ann Castro MRN: [id number] DOB:Nov 29, 1950, 72 y.o., female Today's Date: 03/20/2023   PCP: Dorothey Baseman, MD REFERRING PROVIDER: Dorothey Baseman, MD  END OF SESSION:   PT End of Session - 03/20/23 1028     Visit Number 2    Number of Visits 9   8 + eval   Date for PT Re-Evaluation 05/11/23   pushed out due to scheduling delay   Authorization Type [employer]IC[employer]    PT Start Time 1022    PT Stop Time 1102    PT Time Calculation (min) 40 min    Equipment Utilized During Treatment Gait belt    Activity Tolerance Patient tolerated treatment well    Behavior During Therapy WFL for tasks assessed/performed               Past Medical History:  Diagnosis Date   Arthritis    Diabetes mellitus without complication (HCC)    Elevated cholesterol    Osteopenia    Polymyalgia rheumatica (HCC)    Rheumatic fever    Past Surgical History:  Procedure Laterality Date   ABDOMINAL HYSTERECTOMY     ARTHROSCOPY KNEE W/ DRILLING Left    COLONOSCOPY WITH PROPOFOL N/A 04/24/2016   Procedure: COLONOSCOPY WITH PROPOFOL;  Surgeon: Scot Jun, MD;  Location: [facility] ENDOSCOPY;  Service: Endoscopy;  Laterality: N/A;   COLONOSCOPY WITH PROPOFOL N/A 04/24/2016   Procedure: COLONOSCOPY WITH PROPOFOL;  Surgeon: Scot Jun, MD;  Location: [facility] ENDOSCOPY;  Service: Endoscopy;  Laterality: N/A;   There are no problems to display for this patient.   ONSET DATE: several years ago  REFERRING DIAG: R26.89 (ICD-10-CM) - Other abnormalities of gait and mobility  THERAPY DIAG:  Other abnormalities of gait and mobility  Unsteadiness on feet  Muscle weakness (generalized)  Rationale for Evaluation and Treatment: Rehabilitation  SUBJECTIVE:                                                                                                                                                                                              SUBJECTIVE STATEMENT: Patient was recently sick for a week and just returned to her aerobic class yesterday which tired her out.  She denies additional falls since evaluation or acute changes. Pt accompanied by: self - drove herself  PERTINENT HISTORY: 2 prior knee surgeries done at age 33 and 55 that patient states was for bilateral kneecap instability  PAIN:  Are you having pain? No  PRECAUTIONS: Fall  RED FLAGS: None   WEIGHT BEARING RESTRICTIONS: No  FALLS: Has patient fallen  in last 6 months? Yes. Number of falls 1 - when she was dancing  LIVING ENVIRONMENT: Lives with: lives with their spouse and 1 small dog to care for Lives in: House/apartment Stairs: Yes: Internal: 15-16 steps; on left going up and External: 4 steps; can reach both Has following equipment at home: Shower bench, Grab bars, and Hurrycane  PLOF: Independent  PATIENT GOALS: "Make me pick my feet up."  OBJECTIVE:   DIAGNOSTIC FINDINGS: No recent relevant imaging.  COGNITION: Overall cognitive status: Within functional limits for tasks assessed   SENSATION: Light touch: WFL  COORDINATION: LE RAMS:  WNL Bilateral Heel-to-shin:  WNL  EDEMA:  None noted in BLE  MUSCLE TONE: None noted in BLE  POSTURE: No Significant postural limitations  LOWER EXTREMITY ROM:     Active  Right Eval Left Eval  Hip flexion WNL WNL  Hip extension    Hip abduction " "  Hip adduction    Hip internal rotation    Hip external rotation    Knee flexion " "  Knee extension " "  Ankle dorsiflexion " "  Ankle plantarflexion    Ankle inversion    Ankle eversion     (Blank rows = not tested)  LOWER EXTREMITY MMT:    MMT Right Eval Left Eval  Hip flexion 4+/5 4+/5  Hip extension    Hip abduction 4+/5 4+/5  Hip adduction    Hip internal rotation    Hip external rotation    Knee flexion    Knee extension 5/5 5/5  Ankle dorsiflexion 4+/5 4+/5  Ankle plantarflexion    Ankle inversion    Ankle  eversion    (Blank rows = not tested)  BED MOBILITY:  Sit to supine Complete Independence Supine to sit Complete Independence Rolling to Right Complete Independence Rolling to Left Complete Independence  TRANSFERS: Assistive device utilized: None  Sit to stand: Modified independence-uses hands intermittently to stand Stand to sit: Complete Independence Chair to chair: Complete Independence Floor: CGA and Min A - per report  GAIT: Gait pattern: step through pattern, decreased stride length, shuffling, decreased trunk rotation, and narrow BOS Distance walked: various clinic distances Assistive device utilized: None Level of assistance: SBA and CGA Comments: Significant bowing of the LLE contributing to narrowed BOS w/ foot positioned medially.  FUNCTIONAL TESTS:  5 times sit to stand: 11.72 seconds w/ BUE support, mild propulsion into standing, immediate standing balance is unsteady and inconsistent 10 meter walk test: 10.22 seconds no AD SBA = 0.98 m/sec OR 3.23 ft/sec Functional gait assessment: 19/30   PATIENT SURVEYS:  None completed due to time.  TODAY'S TREATMENT:                                                                                                                              DATE: 03/20/2023   -STS 2x10 -Calf raises 2x12 -Tandem stance progressing away from UE support over 2 minutes each LE  in rear -Standing feet together eyes closed 4x30 seconds -Standing feet together eyes open head turns x30 seconds (no sway) > standing feet together eyes closed head turns 2x30 seconds (min sway)  -Provided and reviewed fall prevention handout  -Forward step ups x20 using BUE on rails > x10 laterally BUE on rail each side  PATIENT EDUCATION: Education details: Encouraged patient to bring her cane to next session.  Fall prevention handout.  Requested patient wear tennis shoes next visit for safety. Person educated: Patient Education method: Explanation Education  comprehension: verbalized understanding  HOME EXERCISE PROGRAM: HEP set for 3 days per week to allow time for patient aerobic class 3 days per week.  Access Code: [email or web address] URL: [email or web address] Date: 03/20/2023 Prepared by: Camille Bal  Exercises - Sit to Stand with Arms Crossed  - 1 x daily - 3 x weekly - 2 sets - 10 reps - Heel Raises with Counter Support  - 1 x daily - 3 x weekly - 3 sets - 10 reps - Standing Tandem Balance with Counter Support  - 1 x daily - 3 x weekly - 1 sets - 3-4 reps - 30 seconds hold - Corner Balance Feet Together With Eyes Closed  - 1 x daily - 3 x weekly - 1 sets - 3-4 reps - 30 seconds hold - Corner Balance Feet Together: Eyes Closed With Head Turns  - 1 x daily - 3 x weekly - 1 sets - 3-4 reps - 30 seconds hold  GOALS: Goals reviewed with patient? Yes  SHORT TERM GOALS: Target date: 03/23/2023  Pt will be independent and compliant with initial strengthening and balance HEP in order to maintain functional progress and improve mobility. Baseline:  Provided (8/27) Goal status: ONGOING  2.  Patient will demonstrate understanding of fall prevention education. Baseline: Handout provided (8/27) Goal status: ONGOING  LONG TERM GOALS: Target date: 04/20/2023 (update for remaining visits at time of assessment)  Pt will be independent and compliant with advanced initial strengthening and balance HEP in order to maintain functional progress and improve mobility. Baseline: To be established. Goal status: INITIAL  2.  Patient will demonstrate floor recovery with no more than SBA in order to improve safe mobility in the event of a fall in the home environment. Baseline:  CGA-MinA Goal status: INITIAL  3.  Patient will demonstrate 5xSTS assessment with consistent steady immediate standing balance on each rep to demonstrate improved functional BLE strength and balance strategies. Baseline: 11.72 seconds w/ BUE support, mild propulsion into  standing, immediate standing balance is unsteady and inconsistent Goal status: INITIAL  4.  Pt will demonstrate a gait speed of >/=3.43 feet/sec in order to decrease risk for falls. Baseline: 3.23 ft/sec Goal status: INITIAL  5.  Pt will improve FGA score to >/=23/30 in order to demonstrate improved balance and decreased fall risk. Baseline: 19/30 Goal status: INITIAL  ASSESSMENT:  CLINICAL IMPRESSION: Introductory static stance and functional strengthening HEP initiated today with patient performing well in clinic.  She will bring her personal cane for practice next session.  She remains limited by decreased activity tolerance somewhat impacted by recent illness but is slowly returning to her community based aerobics class.  Will continue to address LE strengthening and balance strategies in setting of fixed postural abnormality on LE.  OBJECTIVE IMPAIRMENTS: Abnormal gait, decreased activity tolerance, decreased balance, decreased knowledge of use of DME, difficulty walking, decreased strength, improper body mechanics, and postural dysfunction.   ACTIVITY LIMITATIONS: lifting, squatting, stairs, transfers,  and locomotion level  PARTICIPATION LIMITATIONS: community activity and yard work  PERSONAL FACTORS: Age, Fitness, Past/current experiences, Time since onset of injury/illness/exacerbation, and 1 comorbidity: prior surgical changes  are also affecting patient's functional outcome.   REHAB POTENTIAL: Good  CLINICAL DECISION MAKING: Stable/uncomplicated  EVALUATION COMPLEXITY: Low  PLAN:  PT FREQUENCY: 1x/week  PT DURATION: 12 weeks (only scheduling 8 visits to start)  PLANNED INTERVENTIONS: Therapeutic exercises, Therapeutic activity, Neuromuscular re-education, Balance training, Gait training, Patient/Family education, Self Care, Joint mobilization, Stair training, Vestibular training, Orthotic/Fit training, DME instructions, Taping, Manual therapy, and Re-evaluation  PLAN  FOR NEXT SESSION: Add to HEP for  dynamic balance and functional LE strength.   Floor recovery.  Hurdles.  Gait training outdoor/unlevel - did pt bring her cane?   Sadie Haber, PT, DPT 03/20/2023, 11:06 AM

## 2023-03-20 NOTE — Patient Instructions (Signed)
Access Code: [email or web address] URL: [email or web address] Date: 03/20/2023 Prepared by: Camille Bal  Exercises - Sit to Stand with Arms Crossed  - 1 x daily - 3 x weekly - 2 sets - 10 reps - Heel Raises with Counter Support  - 1 x daily - 3 x weekly - 3 sets - 10 reps - Standing Tandem Balance with Counter Support  - 1 x daily - 3 x weekly - 1 sets - 3-4 reps - 30 seconds hold - Corner Balance Feet Together With Eyes Closed  - 1 x daily - 3 x weekly - 1 sets - 3-4 reps - 30 seconds hold - Corner Balance Feet Together: Eyes Closed With Head Turns  - 1 x daily - 3 x weekly - 1 sets - 3-4 reps - 30 seconds hold

## 2023-03-27 ENCOUNTER — Ambulatory Visit: Payer: Medicare PPO | Attending: Family Medicine | Admitting: Physical Therapy

## 2023-03-27 ENCOUNTER — Encounter: Payer: Self-pay | Admitting: Physical Therapy

## 2023-03-27 DIAGNOSIS — M6281 Muscle weakness (generalized): Secondary | ICD-10-CM | POA: Diagnosis present

## 2023-03-27 DIAGNOSIS — R2689 Other abnormalities of gait and mobility: Secondary | ICD-10-CM | POA: Diagnosis present

## 2023-03-27 DIAGNOSIS — R2681 Unsteadiness on feet: Secondary | ICD-10-CM | POA: Diagnosis present

## 2023-03-27 NOTE — Therapy (Signed)
OUTPATIENT PHYSICAL THERAPY NEURO TREATMENT   Patient Name: Ann Castro MRN: [id number] DOB:01-Mar-1951, 72 y.o., female Today's Date: 03/27/2023   PCP: Dorothey Baseman, MD REFERRING PROVIDER: Dorothey Baseman, MD  END OF SESSION:   PT End of Session - 03/27/23 1019     Visit Number 3    Number of Visits 9   8 + eval   Date for PT Re-Evaluation 05/11/23   pushed out due to scheduling delay   Authorization Type [employer]ICARE    PT Start Time 1017    PT Stop Time 1058    PT Time Calculation (min) 41 min    Equipment Utilized During Treatment Gait belt    Activity Tolerance Patient tolerated treatment well    Behavior During Therapy WFL for tasks assessed/performed               Past Medical History:  Diagnosis Date   Arthritis    Diabetes mellitus without complication (HCC)    Elevated cholesterol    Osteopenia    Polymyalgia rheumatica (HCC)    Rheumatic fever    Past Surgical History:  Procedure Laterality Date   ABDOMINAL HYSTERECTOMY     ARTHROSCOPY KNEE W/ DRILLING Left    COLONOSCOPY WITH PROPOFOL N/A 04/24/2016   Procedure: COLONOSCOPY WITH PROPOFOL;  Surgeon: Scot Jun, MD;  Location: [facility] ENDOSCOPY;  Service: Endoscopy;  Laterality: N/A;   COLONOSCOPY WITH PROPOFOL N/A 04/24/2016   Procedure: COLONOSCOPY WITH PROPOFOL;  Surgeon: Scot Jun, MD;  Location: [facility] ENDOSCOPY;  Service: Endoscopy;  Laterality: N/A;   There are no problems to display for this patient.   ONSET DATE: several years ago  REFERRING DIAG: R26.89 (ICD-10-CM) - Other abnormalities of gait and mobility  THERAPY DIAG:  Other abnormalities of gait and mobility  Unsteadiness on feet  Muscle weakness (generalized)  Rationale for Evaluation and Treatment: Rehabilitation  SUBJECTIVE:                                                                                                                                                                                              SUBJECTIVE STATEMENT: Patient denies recent falls or acute changes.  She ambulates in without AD. Pt accompanied by: self - drove herself  PERTINENT HISTORY: 2 prior knee surgeries done at age 39 and 1 that patient states was for bilateral kneecap instability  PAIN:  Are you having pain? No  PRECAUTIONS: Fall  RED FLAGS: None   WEIGHT BEARING RESTRICTIONS: No  FALLS: Has patient fallen in last 6 months? Yes. Number of falls 1 - when she was dancing  LIVING  ENVIRONMENT: Lives with: lives with their spouse and 1 small dog to care for Lives in: House/apartment Stairs: Yes: Internal: 15-16 steps; on left going up and External: 4 steps; can reach both Has following equipment at home: Shower bench, Grab bars, and Hurrycane  PLOF: Independent  PATIENT GOALS: "Make me pick my feet up."  OBJECTIVE:   DIAGNOSTIC FINDINGS: No recent relevant imaging.  COGNITION: Overall cognitive status: Within functional limits for tasks assessed   SENSATION: Light touch: WFL  COORDINATION: LE RAMS:  WNL Bilateral Heel-to-shin:  WNL  EDEMA:  None noted in BLE  MUSCLE TONE: None noted in BLE  POSTURE: No Significant postural limitations  LOWER EXTREMITY ROM:     Active  Right Eval Left Eval  Hip flexion WNL WNL  Hip extension    Hip abduction " "  Hip adduction    Hip internal rotation    Hip external rotation    Knee flexion " "  Knee extension " "  Ankle dorsiflexion " "  Ankle plantarflexion    Ankle inversion    Ankle eversion     (Blank rows = not tested)  LOWER EXTREMITY MMT:    MMT Right Eval Left Eval  Hip flexion 4+/5 4+/5  Hip extension    Hip abduction 4+/5 4+/5  Hip adduction    Hip internal rotation    Hip external rotation    Knee flexion    Knee extension 5/5 5/5  Ankle dorsiflexion 4+/5 4+/5  Ankle plantarflexion    Ankle inversion    Ankle eversion    (Blank rows = not tested)  BED MOBILITY:  Sit to supine Complete  Independence Supine to sit Complete Independence Rolling to Right Complete Independence Rolling to Left Complete Independence  TRANSFERS: Assistive device utilized: None  Sit to stand: Modified independence-uses hands intermittently to stand Stand to sit: Complete Independence Chair to chair: Complete Independence Floor: CGA and Min A - per report  GAIT: Gait pattern: step through pattern, decreased stride length, shuffling, decreased trunk rotation, and narrow BOS Distance walked: various clinic distances Assistive device utilized: None Level of assistance: SBA and CGA Comments: Significant bowing of the LLE contributing to narrowed BOS w/ foot positioned medially.  FUNCTIONAL TESTS:  5 times sit to stand: 11.72 seconds w/ BUE support, mild propulsion into standing, immediate standing balance is unsteady and inconsistent 10 meter walk test: 10.22 seconds no AD SBA = 0.98 m/sec OR 3.23 ft/sec Functional gait assessment: 19/30   PATIENT SURVEYS:  None completed due to time.  TODAY'S TREATMENT:                                                                                                                              DATE: 03/27/2023  Floor Recovery: Patient educated in floor recovery this visit using return demo and teach-back for injury assessment and sequencing of task in clinic setting.  Discussion of transfer of skills to variable scenarios outside  the clinic.  Patient has little to no difficulty with task.  Performed 1 time. Caregiver Training:  Caregiver not present..   Level of Assist:  IND.    GAIT: Gait pattern: step through pattern, decreased arm swing- Left, decreased stride length, and narrow BOS Distance walked: 800 ft Assistive device utilized:  Hurrycane Level of assistance: SBA Comments: Patient ambulates well over unlevel sidewalk and grass, but has very cautious gait pattern with reduced step size, decreased pace, and clutches left thigh with left hand during  stepping in grass.  She does well sequencing cane.  -Hurdles forward and laterally 4x10 ft first with 4" hurdles then repeated with 8" hurdles, pt has most difficulty clearing LLE during lateral hurdles due to knee tightness and requiring increased lateral lean and hip flexion to safely clear hurdle, no UE support  PATIENT EDUCATION: Education details:  Encouraged bringing cane to future sessions and benefit of using with long, unlevel, or unfamiliar surfaces. Person educated: Patient Education method: Explanation Education comprehension: verbalized understanding  HOME EXERCISE PROGRAM: HEP set for 3 days per week to allow time for patient aerobic class 3 days per week.  Access Code: [email or web address] URL: [email or web address] Date: 03/20/2023 Prepared by: Camille Bal  Exercises - Sit to Stand with Arms Crossed  - 1 x daily - 3 x weekly - 2 sets - 10 reps - Heel Raises with Counter Support  - 1 x daily - 3 x weekly - 3 sets - 10 reps - Standing Tandem Balance with Counter Support  - 1 x daily - 3 x weekly - 1 sets - 3-4 reps - 30 seconds hold - Corner Balance Feet Together With Eyes Closed  - 1 x daily - 3 x weekly - 1 sets - 3-4 reps - 30 seconds hold - Corner Balance Feet Together: Eyes Closed With Head Turns  - 1 x daily - 3 x weekly - 1 sets - 3-4 reps - 30 seconds hold  GOALS: Goals reviewed with patient? Yes  SHORT TERM GOALS: Target date: 03/23/2023  Pt will be independent and compliant with initial strengthening and balance HEP in order to maintain functional progress and improve mobility. Baseline:  Provided (8/27) Goal status: ONGOING  2.  Patient will demonstrate understanding of fall prevention education. Baseline: Handout provided (8/27) Goal status: ONGOING  LONG TERM GOALS: Target date: 04/20/2023 (update for remaining visits at time of assessment)  Pt will be independent and compliant with advanced initial strengthening and balance HEP in order to  maintain functional progress and improve mobility. Baseline: To be established. Goal status: INITIAL  2.  Patient will demonstrate floor recovery with no more than SBA in order to improve safe mobility in the event of a fall in the home environment. Baseline:  CGA-MinA Goal status: INITIAL  3.  Patient will demonstrate 5xSTS assessment with consistent steady immediate standing balance on each rep to demonstrate improved functional BLE strength and balance strategies. Baseline: 11.72 seconds w/ BUE support, mild propulsion into standing, immediate standing balance is unsteady and inconsistent Goal status: INITIAL  4.  Pt will demonstrate a gait speed of >/=3.43 feet/sec in order to decrease risk for falls. Baseline: 3.23 ft/sec Goal status: INITIAL  5.  Pt will improve FGA score to >/=23/30 in order to demonstrate improved balance and decreased fall risk. Baseline: 19/30 Goal status: INITIAL  ASSESSMENT:  CLINICAL IMPRESSION: Focus of skilled session on addressing safety with floor recovery with patient able to perform independently.  Further addressed gait using  Hurrycane similar to patient's personal cane.  She does very well with this option over long and unlevel outdoor distances.  She continues to benefit from skilled PT to address ongoing imbalance and gait abnormality as able.  OBJECTIVE IMPAIRMENTS: Abnormal gait, decreased activity tolerance, decreased balance, decreased knowledge of use of DME, difficulty walking, decreased strength, improper body mechanics, and postural dysfunction.   ACTIVITY LIMITATIONS: lifting, squatting, stairs, transfers, and locomotion level  PARTICIPATION LIMITATIONS: community activity and yard work  PERSONAL FACTORS: Age, Fitness, Past/current experiences, Time since onset of injury/illness/exacerbation, and 1 comorbidity: prior surgical changes  are also affecting patient's functional outcome.   REHAB POTENTIAL: Good  CLINICAL DECISION MAKING:  Stable/uncomplicated  EVALUATION COMPLEXITY: Low  PLAN:  PT FREQUENCY: 1x/week  PT DURATION: 12 weeks (only scheduling 8 visits to start)  PLANNED INTERVENTIONS: Therapeutic exercises, Therapeutic activity, Neuromuscular re-education, Balance training, Gait training, Patient/Family education, Self Care, Joint mobilization, Stair training, Vestibular training, Orthotic/Fit training, DME instructions, Taping, Manual therapy, and Re-evaluation  PLAN FOR NEXT SESSION: Add to HEP for  dynamic balance and functional LE strength - try monster walks w/ resistance at thighs.  Heel raises off step.  Tandem stance on incline.  Perturbations.  Tilt board ball toss.  Slam ball step outs.  Gait training w/ and w/o cane varying surfaces.   Sadie Haber, PT, DPT 03/27/2023, 10:58 AM

## 2023-04-03 ENCOUNTER — Encounter: Payer: Self-pay | Admitting: Physical Therapy

## 2023-04-03 ENCOUNTER — Ambulatory Visit: Payer: Medicare PPO | Admitting: Physical Therapy

## 2023-04-03 DIAGNOSIS — R2689 Other abnormalities of gait and mobility: Secondary | ICD-10-CM

## 2023-04-03 DIAGNOSIS — M6281 Muscle weakness (generalized): Secondary | ICD-10-CM

## 2023-04-03 DIAGNOSIS — R2681 Unsteadiness on feet: Secondary | ICD-10-CM

## 2023-04-03 NOTE — Patient Instructions (Signed)
Access Code: [email or web address] URL: [email or web address] Date: 04/03/2023 Prepared by: Camille Bal  Exercises - Sit to Stand with Arms Crossed  - 1 x daily - 3 x weekly - 2 sets - 10 reps - Heel Raises with Counter Support  - 1 x daily - 3 x weekly - 3 sets - 10 reps - only discard this one if can tolerate advanced heel raise off step and feel safe doing with one banister at home! - Standing Tandem Balance with Counter Support  - 1 x daily - 3 x weekly - 1 sets - 3-4 reps - 30 seconds hold - Corner Balance Feet Together With Eyes Closed  - 1 x daily - 3 x weekly - 1 sets - 3-4 reps - 30 seconds hold - Corner Balance Feet Together: Eyes Closed With Head Turns  - 1 x daily - 3 x weekly - 1 sets - 3-4 reps - 30 seconds hold - Forward Backward Monster Walk with Band at [employer] and Counter Support  - 1 x daily - 3 x weekly - 3 sets - 10 reps - Standing Bilateral Heel Raise on Step  - 1 x daily - 3 x weekly - 3 sets - 10 reps

## 2023-04-03 NOTE — Therapy (Signed)
OUTPATIENT PHYSICAL THERAPY NEURO TREATMENT   Patient Name: Ann Castro MRN: [id number] DOB:06-22-1951, 72 y.o., female Today's Date: 04/03/2023   PCP: Dorothey Baseman, MD REFERRING PROVIDER: Dorothey Baseman, MD  END OF SESSION:   PT End of Session - 04/03/23 1021     Visit Number 4    Number of Visits 9   8 + eval   Date for PT Re-Evaluation 05/11/23   pushed out due to scheduling delay   Authorization Type [employer]IC[employer]    PT Start Time 1017    PT Stop Time 1100    PT Time Calculation (min) 43 min    Equipment Utilized During Treatment Gait belt    Activity Tolerance Patient tolerated treatment well    Behavior During Therapy WFL for tasks assessed/performed               Past Medical History:  Diagnosis Date   Arthritis    Diabetes mellitus without complication (HCC)    Elevated cholesterol    Osteopenia    Polymyalgia rheumatica (HCC)    Rheumatic fever    Past Surgical History:  Procedure Laterality Date   ABDOMINAL HYSTERECTOMY     ARTHROSCOPY KNEE W/ DRILLING Left    COLONOSCOPY WITH PROPOFOL N/A 04/24/2016   Procedure: COLONOSCOPY WITH PROPOFOL;  Surgeon: Scot Jun, MD;  Location: [facility] ENDOSCOPY;  Service: Endoscopy;  Laterality: N/A;   COLONOSCOPY WITH PROPOFOL N/A 04/24/2016   Procedure: COLONOSCOPY WITH PROPOFOL;  Surgeon: Scot Jun, MD;  Location: [facility] ENDOSCOPY;  Service: Endoscopy;  Laterality: N/A;   There are no problems to display for this patient.   ONSET DATE: several years ago  REFERRING DIAG: R26.89 (ICD-10-CM) - Other abnormalities of gait and mobility  THERAPY DIAG:  Other abnormalities of gait and mobility  Unsteadiness on feet  Muscle weakness (generalized)  Rationale for Evaluation and Treatment: Rehabilitation  SUBJECTIVE:                                                                                                                                                                                              SUBJECTIVE STATEMENT: Patient denies recent falls or acute changes.  She ambulates in with personal SPC w/ quad rubber tip.  She states she likes this setup better because it makes it more simple to allow the cane to stand on its own. Pt accompanied by: self - drove herself  PERTINENT HISTORY: 2 prior knee surgeries done at age 45 and 50 that patient states was for bilateral kneecap instability  PAIN:  Are you having pain? No  PRECAUTIONS: Fall  RED FLAGS:  None   WEIGHT BEARING RESTRICTIONS: No  FALLS: Has patient fallen in last 6 months? Yes. Number of falls 1 - when she was dancing  LIVING ENVIRONMENT: Lives with: lives with their spouse and 1 small dog to care for Lives in: House/apartment Stairs: Yes: Internal: 15-16 steps; on left going up and External: 4 steps; can reach both Has following equipment at home: Shower bench, Grab bars, and Hurrycane  PLOF: Independent  PATIENT GOALS: "Make me pick my feet up."  OBJECTIVE:   DIAGNOSTIC FINDINGS: No recent relevant imaging.  COGNITION: Overall cognitive status: Within functional limits for tasks assessed   SENSATION: Light touch: WFL  COORDINATION: LE RAMS:  WNL Bilateral Heel-to-shin:  WNL  EDEMA:  None noted in BLE  MUSCLE TONE: None noted in BLE  POSTURE: No Significant postural limitations  LOWER EXTREMITY ROM:     Active  Right Eval Left Eval  Hip flexion WNL WNL  Hip extension    Hip abduction " "  Hip adduction    Hip internal rotation    Hip external rotation    Knee flexion " "  Knee extension " "  Ankle dorsiflexion " "  Ankle plantarflexion    Ankle inversion    Ankle eversion     (Blank rows = not tested)  LOWER EXTREMITY MMT:    MMT Right Eval Left Eval  Hip flexion 4+/5 4+/5  Hip extension    Hip abduction 4+/5 4+/5  Hip adduction    Hip internal rotation    Hip external rotation    Knee flexion    Knee extension 5/5 5/5  Ankle dorsiflexion 4+/5 4+/5  Ankle  plantarflexion    Ankle inversion    Ankle eversion    (Blank rows = not tested)  BED MOBILITY:  Sit to supine Complete Independence Supine to sit Complete Independence Rolling to Right Complete Independence Rolling to Left Complete Independence  TRANSFERS: Assistive device utilized: None  Sit to stand: Modified independence-uses hands intermittently to stand Stand to sit: Complete Independence Chair to chair: Complete Independence Floor: CGA and Min A - per report  GAIT: Gait pattern: step through pattern, decreased stride length, shuffling, decreased trunk rotation, and narrow BOS Distance walked: various clinic distances Assistive device utilized: None Level of assistance: SBA and CGA Comments: Significant bowing of the LLE contributing to narrowed BOS w/ foot positioned medially.  FUNCTIONAL TESTS:  5 times sit to stand: 11.72 seconds w/ BUE support, mild propulsion into standing, immediate standing balance is unsteady and inconsistent 10 meter walk test: 10.22 seconds no AD SBA = 0.98 m/sec OR 3.23 ft/sec Functional gait assessment: 19/30   PATIENT SURVEYS:  None completed due to time.  TODAY'S TREATMENT:                                                                                                                              DATE: 04/03/2023  -Forward monster walks 7x10 ft >  backwards monster walks 4x10 ft -Heel raises off step edge using BUE support 2x20, some musculoskeletal pain in ankles during initial set -Facing up on incline:  feet together eyes closed 2x30 seconds min sway > semi-tandem eyes open 2x30 seconds each LE in rear min-mod sway > semi-tandem eyes closed 2x30 seconds each LE in rear mod sway -Facing down incline:  feet together eyes closed 2x30 seconds moderate sway -Anteriorly oriented tilt board:  holding level no UE support x1 minute, cued to drop arms > forward and backwards tilts no UE support x1 minute > holding level eyes closed 5 x variable  seconds, severe sway close SBA-CGA > mini squats no UE support x 15  PATIENT EDUCATION: Education details:  Encouraged bringing cane to future sessions and benefit of using with long, unlevel, or unfamiliar surfaces. Person educated: Patient Education method: Explanation Education comprehension: verbalized understanding  HOME EXERCISE PROGRAM: HEP set for 3 days per week to allow time for patient aerobic class 3 days per week.  Access Code: [email or web address] URL: [email or web address] Date: 04/03/2023 Prepared by: Camille Bal  Exercises - Sit to Stand with Arms Crossed  - 1 x daily - 3 x weekly - 2 sets - 10 reps - Heel Raises with Counter Support  - 1 x daily - 3 x weekly - 3 sets - 10 reps - only discard this one if can tolerate advanced heel raise off step and feel safe doing with one banister at home! - Standing Tandem Balance with Counter Support  - 1 x daily - 3 x weekly - 1 sets - 3-4 reps - 30 seconds hold - Corner Balance Feet Together With Eyes Closed  - 1 x daily - 3 x weekly - 1 sets - 3-4 reps - 30 seconds hold - Corner Balance Feet Together: Eyes Closed With Head Turns  - 1 x daily - 3 x weekly - 1 sets - 3-4 reps - 30 seconds hold - Forward Backward Monster Walk with Band at [employer] and Counter Support  - 1 x daily - 3 x weekly - 3 sets - 10 reps - Standing Bilateral Heel Raise on Step  - 1 x daily - 3 x weekly - 3 sets - 10 reps  GOALS: Goals reviewed with patient? Yes  SHORT TERM GOALS: Target date: 03/23/2023  Pt will be independent and compliant with initial strengthening and balance HEP in order to maintain functional progress and improve mobility. Baseline:  Provided (8/27) Goal status: ONGOING  2.  Patient will demonstrate understanding of fall prevention education. Baseline: Handout provided (8/27) Goal status: ONGOING  LONG TERM GOALS: Target date: 04/20/2023 (update for remaining visits at time of assessment)  Pt will be independent and  compliant with advanced initial strengthening and balance HEP in order to maintain functional progress and improve mobility. Baseline: To be established. Goal status: INITIAL  2.  Patient will demonstrate floor recovery with no more than SBA in order to improve safe mobility in the event of a fall in the home environment. Baseline:  CGA-MinA Goal status: INITIAL  3.  Patient will demonstrate 5xSTS assessment with consistent steady immediate standing balance on each rep to demonstrate improved functional BLE strength and balance strategies. Baseline: 11.72 seconds w/ BUE support, mild propulsion into standing, immediate standing balance is unsteady and inconsistent Goal status: INITIAL  4.  Pt will demonstrate a gait speed of >/=3.43 feet/sec in order to decrease risk for falls. Baseline: 3.23 ft/sec Goal status: INITIAL  5.  Pt  will improve FGA score to >/=23/30 in order to demonstrate improved balance and decreased fall risk. Baseline: 19/30 Goal status: INITIAL  ASSESSMENT:  CLINICAL IMPRESSION: Time spent this session adding to HEP to advanced ankle strength and general dynamic balance.  She moves well with her SPC and rubber quad tip over level indoor surfaces today and it is probable that this is a good option for all surfaces and conditions given chronic posture of LE and deficits noted prior.  Will continue to assess AD options to provide best recommendations for safest mobility.  OBJECTIVE IMPAIRMENTS: Abnormal gait, decreased activity tolerance, decreased balance, decreased knowledge of use of DME, difficulty walking, decreased strength, improper body mechanics, and postural dysfunction.   ACTIVITY LIMITATIONS: lifting, squatting, stairs, transfers, and locomotion level  PARTICIPATION LIMITATIONS: community activity and yard work  PERSONAL FACTORS: Age, Fitness, Past/current experiences, Time since onset of injury/illness/exacerbation, and 1 comorbidity: prior surgical changes   are also affecting patient's functional outcome.   REHAB POTENTIAL: Good  CLINICAL DECISION MAKING: Stable/uncomplicated  EVALUATION COMPLEXITY: Low  PLAN:  PT FREQUENCY: 1x/week  PT DURATION: 12 weeks (only scheduling 8 visits to start)  PLANNED INTERVENTIONS: Therapeutic exercises, Therapeutic activity, Neuromuscular re-education, Balance training, Gait training, Patient/Family education, Self Care, Joint mobilization, Stair training, Vestibular training, Orthotic/Fit training, DME instructions, Taping, Manual therapy, and Re-evaluation  PLAN FOR NEXT SESSION: Add to HEP for  dynamic balance and functional LE strength.   Perturbations.  Tilt board - ball toss, cone taps.  Slam ball step outs.  Gait training w/ and w/o cane varying surfaces.  Foam beam.  Sadie Haber, PT, DPT 04/03/2023, 11:01 AM

## 2023-04-10 ENCOUNTER — Encounter: Payer: Self-pay | Admitting: Physical Therapy

## 2023-04-10 ENCOUNTER — Ambulatory Visit: Payer: Medicare PPO | Admitting: Physical Therapy

## 2023-04-10 DIAGNOSIS — M6281 Muscle weakness (generalized): Secondary | ICD-10-CM

## 2023-04-10 DIAGNOSIS — R2681 Unsteadiness on feet: Secondary | ICD-10-CM

## 2023-04-10 DIAGNOSIS — R2689 Other abnormalities of gait and mobility: Secondary | ICD-10-CM

## 2023-04-10 NOTE — Therapy (Signed)
OUTPATIENT PHYSICAL THERAPY NEURO TREATMENT   Patient Name: Ann Castro MRN: [id number] DOB:[birth year], 72 y.o., female Today's Date: 04/10/2023   PCP: Dorothey Baseman, MD REFERRING PROVIDER: Dorothey Baseman, MD  END OF SESSION:   PT End of Session - 04/10/23 1024     Visit Number 5    Number of Visits 9   8 + eval   Date for PT Re-Evaluation 05/11/23   pushed out due to scheduling delay   Authorization Type [employer] MEDICARE    PT Start Time 1020    PT Stop Time 1100    PT Time Calculation (min) 40 min    Equipment Utilized During Treatment Gait belt    Activity Tolerance Patient tolerated treatment well    Behavior During Therapy WFL for tasks assessed/performed               Past Medical History:  Diagnosis Date   Arthritis    Diabetes mellitus without complication (HCC)    Elevated cholesterol    Osteopenia    Polymyalgia rheumatica (HCC)    Rheumatic fever    Past Surgical History:  Procedure Laterality Date   ABDOMINAL HYSTERECTOMY     ARTHROSCOPY KNEE W/ DRILLING Left    COLONOSCOPY WITH PROPOFOL N/A 04/24/2016   Procedure: COLONOSCOPY WITH PROPOFOL;  Surgeon: Scot Jun, MD;  Location: [facility] ENDOSCOPY;  Service: Endoscopy;  Laterality: N/A;   COLONOSCOPY WITH PROPOFOL N/A 04/24/2016   Procedure: COLONOSCOPY WITH PROPOFOL;  Surgeon: Scot Jun, MD;  Location: [facility] ENDOSCOPY;  Service: Endoscopy;  Laterality: N/A;   There are no problems to display for this patient.   ONSET DATE: several years ago  REFERRING DIAG: R26.89 (ICD-10-CM) - Other abnormalities of gait and mobility  THERAPY DIAG:  Other abnormalities of gait and mobility  Unsteadiness on feet  Muscle weakness (generalized)  Rationale for Evaluation and Treatment: Rehabilitation  SUBJECTIVE:                                                                                                                                                                                              SUBJECTIVE STATEMENT: Patient denies recent falls or acute changes.  She ambulates in without cane.  She states she is having no pain today. Pt accompanied by: self - drove herself  PERTINENT HISTORY: 2 prior knee surgeries done at age 50 and 68 that patient states was for bilateral kneecap instability  PAIN:  Are you having pain? No  PRECAUTIONS: Fall  RED FLAGS: None   WEIGHT BEARING RESTRICTIONS: No  FALLS: Has patient fallen in last 6 months? Yes. Number of  falls 1 - when she was dancing  LIVING ENVIRONMENT: Lives with: lives with their spouse and 1 small dog to care for Lives in: House/apartment Stairs: Yes: Internal: 15-16 steps; on left going up and External: 4 steps; can reach both Has following equipment at home: Shower bench, Grab bars, and Hurrycane  PLOF: Independent  PATIENT GOALS: "Make me pick my feet up."  OBJECTIVE:   DIAGNOSTIC FINDINGS: No recent relevant imaging.  COGNITION: Overall cognitive status: Within functional limits for tasks assessed   SENSATION: Light touch: WFL  COORDINATION: LE RAMS:  WNL Bilateral Heel-to-shin:  WNL  EDEMA:  None noted in BLE  MUSCLE TONE: None noted in BLE  POSTURE: No Significant postural limitations  LOWER EXTREMITY ROM:     Active  Right Eval Left Eval  Hip flexion WNL WNL  Hip extension    Hip abduction " "  Hip adduction    Hip internal rotation    Hip external rotation    Knee flexion " "  Knee extension " "  Ankle dorsiflexion " "  Ankle plantarflexion    Ankle inversion    Ankle eversion     (Blank rows = not tested)  LOWER EXTREMITY MMT:    MMT Right Eval Left Eval  Hip flexion 4+/5 4+/5  Hip extension    Hip abduction 4+/5 4+/5  Hip adduction    Hip internal rotation    Hip external rotation    Knee flexion    Knee extension 5/5 5/5  Ankle dorsiflexion 4+/5 4+/5  Ankle plantarflexion    Ankle inversion    Ankle eversion    (Blank rows = not tested)  BED MOBILITY:   Sit to supine Complete Independence Supine to sit Complete Independence Rolling to Right Complete Independence Rolling to Left Complete Independence  TRANSFERS: Assistive device utilized: None  Sit to stand: Modified independence-uses hands intermittently to stand Stand to sit: Complete Independence Chair to chair: Complete Independence Floor: CGA and Min A - per report  GAIT: Gait pattern: step through pattern, decreased stride length, shuffling, decreased trunk rotation, and narrow BOS Distance walked: various clinic distances Assistive device utilized: None Level of assistance: SBA and CGA Comments: Significant bowing of the LLE contributing to narrowed BOS w/ foot positioned medially.  FUNCTIONAL TESTS:  5 times sit to stand: 11.72 seconds w/ BUE support, mild propulsion into standing, immediate standing balance is unsteady and inconsistent 10 meter walk test: 10.22 seconds no AD SBA = 0.98 m/sec OR 3.23 ft/sec Functional gait assessment: 19/30   PATIENT SURVEYS:  None completed due to time.  TODAY'S TREATMENT:                                                                                                                              DATE: 04/10/2023  -Resisted walking x115 ft > perturbations x115 ft w/ good crossover stepping strategy noted with larger perturbations laterally, only 1 instance of minA to prevent  excessive right LOB -Anteriorly oriented tilt board:  holding level performing 2 rounds of ball toss midline then multidirectional > midline cone taps without UE support > midline and lateral cones taps w/o UE support -Forward foam beam tandem walking 8 x 10 ft , cued to slow pace and use visual compensation to correct left foot placement > lateral walking on foam beam progressing to no UE support 8 x 10 ft -Standing wide stance 4 lb slam ball slams x6 > step out slam balls x12 each LE > walking slam ball 2x25 ft CGA  PATIENT EDUCATION: Education details:  Discussion  of progressing reps and impact of endurance on balance.  Continue HEP and low-impact aerobic classes. Person educated: Patient Education method: Explanation Education comprehension: verbalized understanding  HOME EXERCISE PROGRAM: HEP set for 3 days per week to allow time for patient aerobic class 3 days per week.  Access Code: [email or web address] URL: [email or web address] Date: 04/03/2023 Prepared by: Camille Bal  Exercises - Sit to Stand with Arms Crossed  - 1 x daily - 3 x weekly - 2 sets - 10 reps - Heel Raises with Counter Support  - 1 x daily - 3 x weekly - 3 sets - 10 reps - only discard this one if can tolerate advanced heel raise off step and feel safe doing with one banister at home! - Standing Tandem Balance with Counter Support  - 1 x daily - 3 x weekly - 1 sets - 3-4 reps - 30 seconds hold - Corner Balance Feet Together With Eyes Closed  - 1 x daily - 3 x weekly - 1 sets - 3-4 reps - 30 seconds hold - Corner Balance Feet Together: Eyes Closed With Head Turns  - 1 x daily - 3 x weekly - 1 sets - 3-4 reps - 30 seconds hold - Forward Backward Monster Walk with Band at [employer] and Counter Support  - 1 x daily - 3 x weekly - 3 sets - 10 reps - Standing Bilateral Heel Raise on Step  - 1 x daily - 3 x weekly - 3 sets - 10 reps  GOALS: Goals reviewed with patient? Yes  SHORT TERM GOALS: Target date: 03/23/2023  Pt will be independent and compliant with initial strengthening and balance HEP in order to maintain functional progress and improve mobility. Baseline:  Provided (8/27) Goal status: ONGOING  2.  Patient will demonstrate understanding of fall prevention education. Baseline: Handout provided (8/27) Goal status: ONGOING  LONG TERM GOALS: Target date: 04/20/2023 (update for remaining visits at time of assessment)  Pt will be independent and compliant with advanced initial strengthening and balance HEP in order to maintain functional progress and improve  mobility. Baseline: To be established. Goal status: INITIAL  2.  Patient will demonstrate floor recovery with no more than SBA in order to improve safe mobility in the event of a fall in the home environment. Baseline:  CGA-MinA Goal status: INITIAL  3.  Patient will demonstrate 5xSTS assessment with consistent steady immediate standing balance on each rep to demonstrate improved functional BLE strength and balance strategies. Baseline: 11.72 seconds w/ BUE support, mild propulsion into standing, immediate standing balance is unsteady and inconsistent Goal status: INITIAL  4.  Pt will demonstrate a gait speed of >/=3.43 feet/sec in order to decrease risk for falls. Baseline: 3.23 ft/sec Goal status: INITIAL  5.  Pt will improve FGA score to >/=23/30 in order to demonstrate improved balance and decreased fall risk. Baseline: 19/30 Goal status:  INITIAL  ASSESSMENT:  CLINICAL IMPRESSION: Emphasis of skilled PT session on addressing dynamic and static stability.  She demonstrates good ankle and stepping strategies in appropriate conditions today.  She remains somewhat fearful of large amplitude mobility and SLS.  She continues to benefit from skilled PT POC to further address fall risk, assist in maintained high level of independence, and further address chronic LE posture shifting COM.  OBJECTIVE IMPAIRMENTS: Abnormal gait, decreased activity tolerance, decreased balance, decreased knowledge of use of DME, difficulty walking, decreased strength, improper body mechanics, and postural dysfunction.   ACTIVITY LIMITATIONS: lifting, squatting, stairs, transfers, and locomotion level  PARTICIPATION LIMITATIONS: community activity and yard work  PERSONAL FACTORS: Age, Fitness, Past/current experiences, Time since onset of injury/illness/exacerbation, and 1 comorbidity: prior surgical changes  are also affecting patient's functional outcome.   REHAB POTENTIAL: Good  CLINICAL DECISION MAKING:  Stable/uncomplicated  EVALUATION COMPLEXITY: Low  PLAN:  PT FREQUENCY: 1x/week  PT DURATION: 12 weeks (only scheduling 8 visits to start)  PLANNED INTERVENTIONS: Therapeutic exercises, Therapeutic activity, Neuromuscular re-education, Balance training, Gait training, Patient/Family education, Self Care, Joint mobilization, Stair training, Vestibular training, Orthotic/Fit training, DME instructions, Taping, Manual therapy, and Re-evaluation  PLAN FOR NEXT SESSION: Add to HEP for  dynamic balance and functional LE strength.     Gait training w/ and w/o cane varying surfaces.  ASSESS LTGs- update date to 12 wk/last visit mark.  Sadie Haber, PT, DPT 04/10/2023, 11:03 AM

## 2023-04-17 ENCOUNTER — Encounter: Payer: Self-pay | Admitting: Physical Therapy

## 2023-04-17 ENCOUNTER — Ambulatory Visit: Payer: Medicare PPO | Admitting: Physical Therapy

## 2023-04-17 DIAGNOSIS — R2689 Other abnormalities of gait and mobility: Secondary | ICD-10-CM | POA: Diagnosis not present

## 2023-04-17 DIAGNOSIS — R2681 Unsteadiness on feet: Secondary | ICD-10-CM

## 2023-04-17 DIAGNOSIS — M6281 Muscle weakness (generalized): Secondary | ICD-10-CM

## 2023-04-17 NOTE — Therapy (Signed)
OUTPATIENT PHYSICAL THERAPY NEURO TREATMENT   Patient Name: Ann Castro MRN: [id number] DOB:30-Nov-1950, 72 y.o., female Today's Date: 04/17/2023   PCP: Dorothey Baseman, MD REFERRING PROVIDER: Dorothey Baseman, MD  END OF SESSION:   PT End of Session - 04/17/23 1021     Visit Number 6    Number of Visits 9   8 + eval   Date for PT Re-Evaluation 05/11/23   pushed out due to scheduling delay   Authorization Type [employer] MEDICARE    PT Start Time 1016    PT Stop Time 1057    PT Time Calculation (min) 41 min    Equipment Utilized During Treatment Gait belt    Activity Tolerance Patient tolerated treatment well    Behavior During Therapy WFL for tasks assessed/performed               Past Medical History:  Diagnosis Date   Arthritis    Diabetes mellitus without complication (HCC)    Elevated cholesterol    Osteopenia    Polymyalgia rheumatica (HCC)    Rheumatic fever    Past Surgical History:  Procedure Laterality Date   ABDOMINAL HYSTERECTOMY     ARTHROSCOPY KNEE W/ DRILLING Left    COLONOSCOPY WITH PROPOFOL N/A 04/24/2016   Procedure: COLONOSCOPY WITH PROPOFOL;  Surgeon: Scot Jun, MD;  Location: [facility] ENDOSCOPY;  Service: Endoscopy;  Laterality: N/A;   COLONOSCOPY WITH PROPOFOL N/A 04/24/2016   Procedure: COLONOSCOPY WITH PROPOFOL;  Surgeon: Scot Jun, MD;  Location: [facility] ENDOSCOPY;  Service: Endoscopy;  Laterality: N/A;   There are no problems to display for this patient.   ONSET DATE: several years ago  REFERRING DIAG: R26.89 (ICD-10-CM) - Other abnormalities of gait and mobility  THERAPY DIAG:  Other abnormalities of gait and mobility  Unsteadiness on feet  Muscle weakness (generalized)  Rationale for Evaluation and Treatment: Rehabilitation  SUBJECTIVE:                                                                                                                                                                                              SUBJECTIVE STATEMENT: Patient denies recent falls or acute changes.  She ambulates in without cane.  She states her left knee has been bothering her and more swollen since last Friday.  This comes and goes though and is better at current.  She states she has been doing her HEP and this didn't seem to make things much worse. Pt accompanied by: self - drove herself  PERTINENT HISTORY: 2 prior knee surgeries done at age 74 and 18 that patient states was for  bilateral kneecap instability  PAIN:  Are you having pain? No - left knee hurts with certain high demand activities  PRECAUTIONS: Fall  RED FLAGS: None   WEIGHT BEARING RESTRICTIONS: No  FALLS: Has patient fallen in last 6 months? Yes. Number of falls 1 - when she was dancing  LIVING ENVIRONMENT: Lives with: lives with their spouse and 1 small dog to care for Lives in: House/apartment Stairs: Yes: Internal: 15-16 steps; on left going up and External: 4 steps; can reach both Has following equipment at home: Shower bench, Grab bars, and Hurrycane  PLOF: Independent  PATIENT GOALS: "Make me pick my feet up."  OBJECTIVE:   DIAGNOSTIC FINDINGS: No recent relevant imaging.  COGNITION: Overall cognitive status: Within functional limits for tasks assessed   SENSATION: Light touch: WFL  COORDINATION: LE RAMS:  WNL Bilateral Heel-to-shin:  WNL  EDEMA:  None noted in BLE  MUSCLE TONE: None noted in BLE  POSTURE: No Significant postural limitations  LOWER EXTREMITY ROM:     Active  Right Eval Left Eval  Hip flexion WNL WNL  Hip extension    Hip abduction " "  Hip adduction    Hip internal rotation    Hip external rotation    Knee flexion " "  Knee extension " "  Ankle dorsiflexion " "  Ankle plantarflexion    Ankle inversion    Ankle eversion     (Blank rows = not tested)  LOWER EXTREMITY MMT:    MMT Right Eval Left Eval  Hip flexion 4+/5 4+/5  Hip extension    Hip abduction 4+/5 4+/5  Hip  adduction    Hip internal rotation    Hip external rotation    Knee flexion    Knee extension 5/5 5/5  Ankle dorsiflexion 4+/5 4+/5  Ankle plantarflexion    Ankle inversion    Ankle eversion    (Blank rows = not tested)  BED MOBILITY:  Sit to supine Complete Independence Supine to sit Complete Independence Rolling to Right Complete Independence Rolling to Left Complete Independence  TRANSFERS: Assistive device utilized: None  Sit to stand: Modified independence-uses hands intermittently to stand Stand to sit: Complete Independence Chair to chair: Complete Independence Floor: CGA and Min A - per report  GAIT: Gait pattern: step through pattern, decreased stride length, shuffling, decreased trunk rotation, and narrow BOS Distance walked: various clinic distances Assistive device utilized: None Level of assistance: SBA and CGA Comments: Significant bowing of the LLE contributing to narrowed BOS w/ foot positioned medially.  FUNCTIONAL TESTS:  5 times sit to stand: 11.72 seconds w/ BUE support, mild propulsion into standing, immediate standing balance is unsteady and inconsistent 10 meter walk test: 10.22 seconds no AD SBA = 0.98 m/sec OR 3.23 ft/sec Functional gait assessment: 19/30  [facility] PT Assessment - 04/17/23 1041       Functional Gait  Assessment   Gait assessed  Yes    Gait Level Surface Walks 20 ft in less than 7 sec but greater than 5.5 sec, uses assistive device, slower speed, mild gait deviations, or deviates 6-10 in outside of the 12 in walkway width.    Change in Gait Speed Able to change speed, demonstrates mild gait deviations, deviates 6-10 in outside of the 12 in walkway width, or no gait deviations, unable to achieve a major change in velocity, or uses a change in velocity, or uses an assistive device.    Gait with Horizontal Head Turns Performs head turns smoothly  with no change in gait. Deviates no more than 6 in outside 12 in walkway width    Gait with  Vertical Head Turns Performs head turns with no change in gait. Deviates no more than 6 in outside 12 in walkway width.    Gait and Pivot Turn Pivot turns safely in greater than 3 sec and stops with no loss of balance, or pivot turns safely within 3 sec and stops with mild imbalance, requires small steps to catch balance.    Step Over Obstacle Is able to step over one shoe box (4.5 in total height) but must slow down and adjust steps to clear box safely. May require verbal cueing.    Gait with Narrow Base of Support Ambulates less than 4 steps heel to toe or cannot perform without assistance.   2 steps   Gait with Eyes Closed Walks 20 ft, slow speed, abnormal gait pattern, evidence for imbalance, deviates 10-15 in outside 12 in walkway width. Requires more than 9 sec to ambulate 20 ft.    Ambulating Backwards Walks 20 ft, uses assistive device, slower speed, mild gait deviations, deviates 6-10 in outside 12 in walkway width.    Steps Alternating feet, must use rail.    Total Score 18    FGA comment: 18/30 = significant fall risk             PATIENT SURVEYS:  None completed due to time.  TODAY'S TREATMENT:                                                                                                                              DATE: 04/17/2023  -Pt has updated copy of HEP, needs no review or reprint. Floor Recovery: Patient educated in floor recovery this visit using teach-back for injury assessment and sequencing of task in clinic setting.  Discussion of transfer of skills to variable scenarios outside the clinic.  Patient has most difficulty with lowering onto knees and obtaining quadruped due to LLE shape and knee pain.  Performed 2 times w/ initial demo from PT.  Education provided on alternatively using posterior bottom bump if has access to low stable surface like low step vs forward facing push-up. Caregiver Training:  Caregiver not present. Level of Assist:  MODI and SBA.     -5xSTS:  12.40 seconds no UE support and steady on all standing attempts - no AD:  8.35 seconds = 1.20 m/sec OR 3.95 ft/sec -FGA:  [facility] PT Assessment - 04/17/23 1041       Functional Gait  Assessment   Gait assessed  Yes    Gait Level Surface Walks 20 ft in less than 7 sec but greater than 5.5 sec, uses assistive device, slower speed, mild gait deviations, or deviates 6-10 in outside of the 12 in walkway width.    Change in Gait Speed Able to change speed, demonstrates mild gait deviations, deviates 6-10 in outside of the 12 in walkway  width, or no gait deviations, unable to achieve a major change in velocity, or uses a change in velocity, or uses an assistive device.    Gait with Horizontal Head Turns Performs head turns smoothly with no change in gait. Deviates no more than 6 in outside 12 in walkway width    Gait with Vertical Head Turns Performs head turns with no change in gait. Deviates no more than 6 in outside 12 in walkway width.    Gait and Pivot Turn Pivot turns safely in greater than 3 sec and stops with no loss of balance, or pivot turns safely within 3 sec and stops with mild imbalance, requires small steps to catch balance.    Step Over Obstacle Is able to step over one shoe box (4.5 in total height) but must slow down and adjust steps to clear box safely. May require verbal cueing.    Gait with Narrow Base of Support Ambulates less than 4 steps heel to toe or cannot perform without assistance.   2 steps   Gait with Eyes Closed Walks 20 ft, slow speed, abnormal gait pattern, evidence for imbalance, deviates 10-15 in outside 12 in walkway width. Requires more than 9 sec to ambulate 20 ft.    Ambulating Backwards Walks 20 ft, uses assistive device, slower speed, mild gait deviations, deviates 6-10 in outside 12 in walkway width.    Steps Alternating feet, must use rail.    Total Score 18    FGA comment: 18/30 = significant fall risk            STAIRS:  Level of  Assistance: SBA  Stair Negotiation Technique: Alternating Pattern  Forwards with Single Rail on Right Bilateral Rails  Number of Stairs: 4x4   Height of Stairs: 6 inches  Comments: Practice using reciprocal pattern and pivoting on LE to compensate for pelvic/hip obliquity due to likely leg length discrepancy.  She is able to reduce support to right rail ascending/left rail descending w/ widened BOS on descent.  PATIENT EDUCATION: Education details:  Edu on PRICE principles to manage left knee pain and edema and continue to do light activities.  Floor recovery.  Recommending use of at least one rail with stairs and to use step-to if the stairs are steep or other adverse conditions.  Discussed her upcoming appt time in respect to PCP visit at [facility] - PT to hold available 1145 slot on 10/1 for her in the event she needs to move her appt time here in order to make it safely. Person educated: Patient Education method: Explanation Education comprehension: verbalized understanding  HOME EXERCISE PROGRAM: HEP set for 3 days per week to allow time for patient aerobic class 3 days per week.  Access Code: [email or web address] URL: [email or web address] Date: 04/03/2023 Prepared by: Camille Bal  Exercises - Sit to Stand with Arms Crossed  - 1 x daily - 3 x weekly - 2 sets - 10 reps - Heel Raises with Counter Support  - 1 x daily - 3 x weekly - 3 sets - 10 reps - only discard this one if can tolerate advanced heel raise off step and feel safe doing with one banister at home! - Standing Tandem Balance with Counter Support  - 1 x daily - 3 x weekly - 1 sets - 3-4 reps - 30 seconds hold - Corner Balance Feet Together With Eyes Closed  - 1 x daily - 3 x weekly - 1 sets - 3-4 reps - 30 seconds hold -  Corner Balance Feet Together: Eyes Closed With Head Turns  - 1 x daily - 3 x weekly - 1 sets - 3-4 reps - 30 seconds hold - Forward Backward Monster Walk with Band at [employer] and Counter Support  - 1 x  daily - 3 x weekly - 3 sets - 10 reps - Standing Bilateral Heel Raise on Step  - 1 x daily - 3 x weekly - 3 sets - 10 reps  GOALS: Goals reviewed with patient? Yes  SHORT TERM GOALS: Target date: 03/23/2023  Pt will be independent and compliant with initial strengthening and balance HEP in order to maintain functional progress and improve mobility. Baseline:  Provided (8/27) Goal status: ONGOING  2.  Patient will demonstrate understanding of fall prevention education. Baseline: Handout provided (8/27) Goal status: ONGOING  LONG TERM GOALS: Target date: 04/20/2023 (update for remaining visits at time of assessment)  Pt will be independent and compliant with advanced initial strengthening and balance HEP in order to maintain functional progress and improve mobility. Baseline: Compliant and IND (9/24) Goal status: MET  2.  Patient will demonstrate floor recovery with no more than SBA in order to improve safe mobility in the event of a fall in the home environment. Baseline:  CGA-MinA; SBA - modI (9/24) Goal status: MET  3.  Patient will demonstrate 5xSTS assessment with consistent steady immediate standing balance on each rep to demonstrate improved functional BLE strength and balance strategies. Baseline: 11.72 seconds w/ BUE support, mild propulsion into standing, immediate standing balance is unsteady and inconsistent; 12.40 seconds no UE support and steady on all standing attempts (9/24) Goal status: MET  4.  Pt will demonstrate a gait speed of >/=3.43 feet/sec in order to decrease risk for falls. Baseline: 3.23 ft/sec; 3.95 ft/sec (9/24) Goal status: MET  5.  Pt will improve FGA score to >/=23/30 in order to demonstrate improved balance and decreased fall risk. Baseline: 19/30; 18/30 (9/24) Goal status: NOT MET  LONG TERM GOALS (updated for remaining visits): Target date: 05/08/2023  1.  Pt will demonstrate a gait speed of >/=4.15 feet/sec in order to decrease risk for  falls. Baseline: 3.95 ft/sec (9/24) Goal status: REVISED  2.  Pt will improve FGA score to >/=23/30 in order to demonstrate improved balance and decreased fall risk. Baseline: 18/30 (9/24) Goal status: INITIAL  ASSESSMENT:  CLINICAL IMPRESSION: Assessed LTGs this session with patient meeting all but 1 goal.  Her FGA goal today was 1 point below initial score of 19/30.  She greatly improved her gait speed to 3.95 ft/sec and her steadiness on immediate standing was much better today without LOB throughout session.  She required SBA on initial floor recovery but was modified independent using support surface on second attempt.  Overall she is progressing towards goals and will continue per POC.  OBJECTIVE IMPAIRMENTS: Abnormal gait, decreased activity tolerance, decreased balance, decreased knowledge of use of DME, difficulty walking, decreased strength, improper body mechanics, and postural dysfunction.   ACTIVITY LIMITATIONS: lifting, squatting, stairs, transfers, and locomotion level  PARTICIPATION LIMITATIONS: community activity and yard work  PERSONAL FACTORS: Age, Fitness, Past/current experiences, Time since onset of injury/illness/exacerbation, and 1 comorbidity: prior surgical changes  are also affecting patient's functional outcome.   REHAB POTENTIAL: Good  CLINICAL DECISION MAKING: Stable/uncomplicated  EVALUATION COMPLEXITY: Low  PLAN:  PT FREQUENCY: 1x/week  PT DURATION: 12 weeks (only scheduling 8 visits to start)  PLANNED INTERVENTIONS: Therapeutic exercises, Therapeutic activity, Neuromuscular re-education, Balance training, Gait training, Patient/Family  education, Self Care, Joint mobilization, Stair training, Vestibular training, Orthotic/Fit training, DME instructions, Taping, Manual therapy, and Re-evaluation  PLAN FOR NEXT SESSION:  Gait training w/ and w/o cane varying surfaces.  Shorten HEP - Work on walking eyes closed, hurdles, and stairs reciprocally, tandem  walking  Sadie Haber, PT, DPT 04/17/2023, 10:58 AM

## 2023-04-24 ENCOUNTER — Encounter: Payer: Self-pay | Admitting: Physical Therapy

## 2023-04-24 ENCOUNTER — Ambulatory Visit: Payer: Medicare PPO | Attending: Family Medicine | Admitting: Physical Therapy

## 2023-04-24 DIAGNOSIS — M6281 Muscle weakness (generalized): Secondary | ICD-10-CM | POA: Insufficient documentation

## 2023-04-24 DIAGNOSIS — R2689 Other abnormalities of gait and mobility: Secondary | ICD-10-CM | POA: Diagnosis present

## 2023-04-24 DIAGNOSIS — R2681 Unsteadiness on feet: Secondary | ICD-10-CM | POA: Diagnosis present

## 2023-04-24 NOTE — Therapy (Signed)
OUTPATIENT PHYSICAL THERAPY NEURO TREATMENT   Patient Name: Ann Castro MRN: [id number] DOB:1951-04-27, 72 y.o., female Today's Date: 04/24/2023   PCP: Dorothey Baseman, MD REFERRING PROVIDER: Dorothey Baseman, MD  END OF SESSION:   PT End of Session - 04/24/23 1024     Visit Number 7    Number of Visits 9   8 + eval   Date for PT Re-Evaluation 05/11/23   pushed out due to scheduling delay   Authorization Type [employer] MEDICARE    PT Start Time 1018    PT Stop Time 1056    PT Time Calculation (min) 38 min    Equipment Utilized During Treatment Gait belt    Activity Tolerance Patient tolerated treatment well    Behavior During Therapy WFL for tasks assessed/performed               Past Medical History:  Diagnosis Date   Arthritis    Diabetes mellitus without complication (HCC)    Elevated cholesterol    Osteopenia    Polymyalgia rheumatica (HCC)    Rheumatic fever    Past Surgical History:  Procedure Laterality Date   ABDOMINAL HYSTERECTOMY     ARTHROSCOPY KNEE W/ DRILLING Left    COLONOSCOPY WITH PROPOFOL N/A 04/24/2016   Procedure: COLONOSCOPY WITH PROPOFOL;  Surgeon: Scot Jun, MD;  Location: [facility] ENDOSCOPY;  Service: Endoscopy;  Laterality: N/A;   COLONOSCOPY WITH PROPOFOL N/A 04/24/2016   Procedure: COLONOSCOPY WITH PROPOFOL;  Surgeon: Scot Jun, MD;  Location: [facility] ENDOSCOPY;  Service: Endoscopy;  Laterality: N/A;   There are no problems to display for this patient.   ONSET DATE: several years ago  REFERRING DIAG: R26.89 (ICD-10-CM) - Other abnormalities of gait and mobility  THERAPY DIAG:  Other abnormalities of gait and mobility  Unsteadiness on feet  Muscle weakness (generalized)  Rationale for Evaluation and Treatment: Rehabilitation  SUBJECTIVE:                                                                                                                                                                                              SUBJECTIVE STATEMENT: Patient denies recent falls or acute changes.  She recently got her flu shot, but is tolerating this well as expected.  She ambulates in without cane.  She states her left knee is not nearly as swollen and it is not bothering her much at all today.  Her 3rd and 4th toes on the RLE are irritated from rubbing each other so she has placed a pad between them to help. Pt accompanied by: self - drove herself  PERTINENT HISTORY:  2 prior knee surgeries done at age 87 and 9 that patient states was for bilateral kneecap instability  PAIN:  Are you having pain? No  PRECAUTIONS: Fall  RED FLAGS: None   WEIGHT BEARING RESTRICTIONS: No  FALLS: Has patient fallen in last 6 months? Yes. Number of falls 1 - when she was dancing  LIVING ENVIRONMENT: Lives with: lives with their spouse and 1 small dog to care for Lives in: House/apartment Stairs: Yes: Internal: 15-16 steps; on left going up and External: 4 steps; can reach both Has following equipment at home: Shower bench, Grab bars, and Hurrycane  PLOF: Independent  PATIENT GOALS: "Make me pick my feet up."  OBJECTIVE:   DIAGNOSTIC FINDINGS: No recent relevant imaging.  COGNITION: Overall cognitive status: Within functional limits for tasks assessed   SENSATION: Light touch: WFL  COORDINATION: LE RAMS:  WNL Bilateral Heel-to-shin:  WNL  EDEMA:  None noted in BLE  MUSCLE TONE: None noted in BLE  POSTURE: No Significant postural limitations  LOWER EXTREMITY ROM:     Active  Right Eval Left Eval  Hip flexion WNL WNL  Hip extension    Hip abduction " "  Hip adduction    Hip internal rotation    Hip external rotation    Knee flexion " "  Knee extension " "  Ankle dorsiflexion " "  Ankle plantarflexion    Ankle inversion    Ankle eversion     (Blank rows = not tested)  LOWER EXTREMITY MMT:    MMT Right Eval Left Eval  Hip flexion 4+/5 4+/5  Hip extension    Hip abduction 4+/5 4+/5  Hip  adduction    Hip internal rotation    Hip external rotation    Knee flexion    Knee extension 5/5 5/5  Ankle dorsiflexion 4+/5 4+/5  Ankle plantarflexion    Ankle inversion    Ankle eversion    (Blank rows = not tested)  BED MOBILITY:  Sit to supine Complete Independence Supine to sit Complete Independence Rolling to Right Complete Independence Rolling to Left Complete Independence  TRANSFERS: Assistive device utilized: None  Sit to stand: Modified independence-uses hands intermittently to stand Stand to sit: Complete Independence Chair to chair: Complete Independence Floor: CGA and Min A - per report  GAIT: Gait pattern: step through pattern, decreased stride length, shuffling, decreased trunk rotation, and narrow BOS Distance walked: various clinic distances Assistive device utilized: None Level of assistance: SBA and CGA Comments: Significant bowing of the LLE contributing to narrowed BOS w/ foot positioned medially.  FUNCTIONAL TESTS:  5 times sit to stand: 11.72 seconds w/ BUE support, mild propulsion into standing, immediate standing balance is unsteady and inconsistent 10 meter walk test: 10.22 seconds no AD SBA = 0.98 m/sec OR 3.23 ft/sec Functional gait assessment: 19/30    PATIENT SURVEYS:  None completed due to time.  TODAY'S TREATMENT:  DATE: 04/24/2023  -STS w/ arms crossed x10 -Standing heel raises x12 -Standing heels raises off step x10, encouraged to do with shoes on due to discomfort otherwise -Feet together eyes closed unsupported, min sway -Tandem unsupported x30 seconds each LE in rear, min-mod sway -Feet together, eyes closed, head turns x45 seconds, min sway initially but this resolves quickly -Monster walks w/ green band at thighs 2x15 ft forward and backwards progressing to no UE support, pt would like to keep this task as  she likes strengthening aspect -Walking forward at counter eyes closed 2x15 ft -Tandem forward and backwards walking 2x15 ft each direction  PATIENT EDUCATION: Education details:  Modifications to HEP, continue previous exercises as desired and as her schedule allows.  Discussion of plan for discharge at end of current POC, pt agrees and feels she can progress herself some after discharge.  Discussed further assessment at last visit for final decision. Person educated: Patient Education method: Explanation Education comprehension: verbalized understanding  HOME EXERCISE PROGRAM: HEP set for 3 days per week to allow time for patient aerobic class 3 days per week.  Access Code: [email or web address] URL: [email or web address] Date: 04/24/2023 Prepared by: Camille Bal  Exercises - Standing Tandem Balance with Counter Support  - 1 x daily - 3 x weekly - 1 sets - 3-4 reps - 30 seconds hold - Corner Balance Feet Together With Eyes Closed  - 1 x daily - 3 x weekly - 1 sets - 3-4 reps - 30 seconds hold - Forward Backward Monster Walk with Band at [employer] and Counter Support  - 1 x daily - 3 x weekly - 3 sets - 10 reps - Standing Bilateral Heel Raise on Step  - 1 x daily - 3 x weekly - 3 sets - 10 reps - Walking with Eyes Closed and Counter Support  - 1 x daily - 3 x weekly - 3 sets - 10 reps - Tandem Walking with Counter Support  - 1 x daily - 3 x weekly - 3 sets - 10 reps - Backward Tandem Walking with Counter Support  - 1 x daily - 3 x weekly - 3 sets - 10 reps  GOALS: Goals reviewed with patient? Yes  SHORT TERM GOALS: Target date: 03/23/2023  Pt will be independent and compliant with initial strengthening and balance HEP in order to maintain functional progress and improve mobility. Baseline:  Provided (8/27) Goal status: ONGOING  2.  Patient will demonstrate understanding of fall prevention education. Baseline: Handout provided (8/27) Goal status: ONGOING  LONG TERM GOALS:  Target date: 04/20/2023 (update for remaining visits at time of assessment)  Pt will be independent and compliant with advanced initial strengthening and balance HEP in order to maintain functional progress and improve mobility. Baseline: Compliant and IND (9/24) Goal status: MET  2.  Patient will demonstrate floor recovery with no more than SBA in order to improve safe mobility in the event of a fall in the home environment. Baseline:  CGA-MinA; SBA - modI (9/24) Goal status: MET  3.  Patient will demonstrate 5xSTS assessment with consistent steady immediate standing balance on each rep to demonstrate improved functional BLE strength and balance strategies. Baseline: 11.72 seconds w/ BUE support, mild propulsion into standing, immediate standing balance is unsteady and inconsistent; 12.40 seconds no UE support and steady on all standing attempts (9/24) Goal status: MET  4.  Pt will demonstrate a gait speed of >/=3.43 feet/sec in order to decrease risk for falls. Baseline: 3.23 ft/sec; 3.95  ft/sec (9/24) Goal status: MET  5.  Pt will improve FGA score to >/=23/30 in order to demonstrate improved balance and decreased fall risk. Baseline: 19/30; 18/30 (9/24) Goal status: NOT MET  LONG TERM GOALS (updated for remaining visits): Target date: 05/08/2023  1.  Pt will demonstrate a gait speed of >/=4.15 feet/sec in order to decrease risk for falls. Baseline: 3.95 ft/sec (9/24) Goal status: REVISED  2.  Pt will improve FGA score to >/=23/30 in order to demonstrate improved balance and decreased fall risk. Baseline: 18/30 (9/24) Goal status: INITIAL  ASSESSMENT:  CLINICAL IMPRESSION: Shortened and progressed HEP to reflect patient progress.  At current she is doing well ambulating without AD and only requires intermittent support from her cane with more challenging surface types.  She is in agreement to plan to discharge in 2 visits.  Will continue per POC until time of LTG  assessment.  OBJECTIVE IMPAIRMENTS: Abnormal gait, decreased activity tolerance, decreased balance, decreased knowledge of use of DME, difficulty walking, decreased strength, improper body mechanics, and postural dysfunction.   ACTIVITY LIMITATIONS: lifting, squatting, stairs, transfers, and locomotion level  PARTICIPATION LIMITATIONS: community activity and yard work  PERSONAL FACTORS: Age, Fitness, Past/current experiences, Time since onset of injury/illness/exacerbation, and 1 comorbidity: prior surgical changes  are also affecting patient's functional outcome.   REHAB POTENTIAL: Good  CLINICAL DECISION MAKING: Stable/uncomplicated  EVALUATION COMPLEXITY: Low  PLAN:  PT FREQUENCY: 1x/week  PT DURATION: 12 weeks (only scheduling 8 visits to start)  PLANNED INTERVENTIONS: Therapeutic exercises, Therapeutic activity, Neuromuscular re-education, Balance training, Gait training, Patient/Family education, Self Care, Joint mobilization, Stair training, Vestibular training, Orthotic/Fit training, DME instructions, Taping, Manual therapy, and Re-evaluation  PLAN FOR NEXT SESSION:  Gait training compliant surfaces no cane, hurdles, and stairs reciprocally  Sadie Haber, PT, DPT 04/24/2023, 10:56 AM

## 2023-04-24 NOTE — Patient Instructions (Signed)
Access Code: [email or web address] URL: [email or web address] Date: 04/24/2023 Prepared by: Camille Bal  Exercises - Standing Tandem Balance with Counter Support  - 1 x daily - 3 x weekly - 1 sets - 3-4 reps - 30 seconds hold - Corner Balance Feet Together With Eyes Closed  - 1 x daily - 3 x weekly - 1 sets - 3-4 reps - 30 seconds hold - Forward Backward Monster Walk with Band at [employer] and Counter Support  - 1 x daily - 3 x weekly - 3 sets - 10 reps - Standing Bilateral Heel Raise on Step  - 1 x daily - 3 x weekly - 3 sets - 10 reps - Walking with Eyes Closed and Counter Support  - 1 x daily - 3 x weekly - 3 sets - 10 reps - Tandem Walking with Counter Support  - 1 x daily - 3 x weekly - 3 sets - 10 reps - Backward Tandem Walking with Counter Support  - 1 x daily - 3 x weekly - 3 sets - 10 reps

## 2023-05-01 ENCOUNTER — Ambulatory Visit: Payer: Medicare PPO | Admitting: Physical Therapy

## 2023-05-08 ENCOUNTER — Encounter: Payer: Self-pay | Admitting: Physical Therapy

## 2023-05-08 ENCOUNTER — Ambulatory Visit: Payer: Medicare PPO | Admitting: Physical Therapy

## 2023-05-08 DIAGNOSIS — M6281 Muscle weakness (generalized): Secondary | ICD-10-CM

## 2023-05-08 DIAGNOSIS — R2689 Other abnormalities of gait and mobility: Secondary | ICD-10-CM | POA: Diagnosis not present

## 2023-05-08 DIAGNOSIS — R2681 Unsteadiness on feet: Secondary | ICD-10-CM

## 2023-05-08 NOTE — Therapy (Signed)
OUTPATIENT PHYSICAL THERAPY NEURO TREATMENT - DISCHARGE SUMMARY   Patient Name: Ann Castro MRN: [id number] DOB:[birth year], 72 y.o., female Today's Date: 05/08/2023   PCP: Dorothey Baseman, MD REFERRING PROVIDER: Dorothey Baseman, MD  PHYSICAL THERAPY DISCHARGE SUMMARY  Visits from Start of Care: 8  Current functional level related to goals / functional outcomes: See clinical impression statement.   Remaining deficits: Chronic LE posture impacting gait mechanics and balance - pt is functional and appears adjusted to change only using cane for intermittent support over more challenging surfaces.   Education / Equipment: Continue HEP and walking regularly to supplement aerobic class.  Progress towards goals and discharge plan for today.   Patient agrees to discharge. Patient goals were partially met. Patient is being discharged due to being pleased with the current functional level.   END OF SESSION:   PT End of Session - 05/08/23 1020     Visit Number 8    Number of Visits 9   8 + eval   Date for PT Re-Evaluation 05/11/23   pushed out due to scheduling delay   Authorization Type [employer]ICARE    PT Start Time 1017    PT Stop Time 1044    PT Time Calculation (min) 27 min    Equipment Utilized During Treatment Gait belt    Activity Tolerance Patient tolerated treatment well    Behavior During Therapy WFL for tasks assessed/performed               Past Medical History:  Diagnosis Date   Arthritis    Diabetes mellitus without complication (HCC)    Elevated cholesterol    Osteopenia    Polymyalgia rheumatica (HCC)    Rheumatic fever    Past Surgical History:  Procedure Laterality Date   ABDOMINAL HYSTERECTOMY     ARTHROSCOPY KNEE W/ DRILLING Left    COLONOSCOPY WITH PROPOFOL N/A 04/24/2016   Procedure: COLONOSCOPY WITH PROPOFOL;  Surgeon: Scot Jun, MD;  Location: [facility] ENDOSCOPY;  Service: Endoscopy;  Laterality: N/A;   COLONOSCOPY WITH PROPOFOL  N/A 04/24/2016   Procedure: COLONOSCOPY WITH PROPOFOL;  Surgeon: Scot Jun, MD;  Location: [facility] ENDOSCOPY;  Service: Endoscopy;  Laterality: N/A;   There are no problems to display for this patient.   ONSET DATE: several years ago  REFERRING DIAG: R26.89 (ICD-10-CM) - Other abnormalities of gait and mobility  THERAPY DIAG:  Other abnormalities of gait and mobility  Unsteadiness on feet  Muscle weakness (generalized)  Rationale for Evaluation and Treatment: Rehabilitation  SUBJECTIVE:  SUBJECTIVE STATEMENT: Patient apologizes for missing last session as she had 4 other things scheduled around that time and she just missed it.  She still feels comfortable with discharging today as she feels stronger and notices she is not getting tired as quickly.  She denies falls or acute changes. 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 No  PRECAUTIONS: Fall  RED FLAGS: None   WEIGHT BEARING RESTRICTIONS: No  FALLS: Has patient fallen in last 6 months? Yes. Number of falls 1 - when she was dancing  LIVING ENVIRONMENT: Lives with: lives with their spouse and 1 small dog to care for Lives in: House/apartment Stairs: Yes: Internal: 15-16 steps; on left going up and External: 4 steps; can reach both Has following equipment at home: Shower bench, Grab bars, and Hurrycane  PLOF: Independent  PATIENT GOALS: "Make me pick my feet up."  OBJECTIVE:   DIAGNOSTIC FINDINGS: No recent relevant imaging.  COGNITION: Overall cognitive status: Within functional limits for tasks assessed   SENSATION: Light touch: WFL  COORDINATION: LE RAMS:   WNL Bilateral Heel-to-shin:  WNL  EDEMA:  None noted in BLE  MUSCLE TONE: None noted in BLE  POSTURE: No Significant postural limitations  LOWER EXTREMITY ROM:     Active  Right Eval Left Eval  Hip flexion WNL WNL  Hip extension    Hip abduction " "  Hip adduction    Hip internal rotation    Hip external rotation    Knee flexion " "  Knee extension " "  Ankle dorsiflexion " "  Ankle plantarflexion    Ankle inversion    Ankle eversion     (Blank rows = not tested)  LOWER EXTREMITY MMT:    MMT Right Eval Left Eval  Hip flexion 4+/5 4+/5  Hip extension    Hip abduction 4+/5 4+/5  Hip adduction    Hip internal rotation    Hip external rotation    Knee flexion    Knee extension 5/5 5/5  Ankle dorsiflexion 4+/5 4+/5  Ankle plantarflexion    Ankle inversion    Ankle eversion    (Blank rows = not tested)  BED MOBILITY:  Sit to supine Complete Independence Supine to sit Complete Independence Rolling to Right Complete Independence Rolling to Left Complete Independence  TRANSFERS: Assistive device utilized: None  Sit to stand: Modified independence-uses hands intermittently to stand Stand to sit: Complete Independence Chair to chair: Complete Independence Floor: CGA and Min A - per report  GAIT: Gait pattern: step through pattern, decreased stride length, shuffling, decreased trunk rotation, and narrow BOS Distance walked: various clinic distances Assistive device utilized: None Level of assistance: SBA and CGA Comments: Significant bowing of the LLE contributing to narrowed BOS w/ foot positioned medially.  FUNCTIONAL TESTS:  5 times sit to stand: 11.72 seconds w/ BUE support, mild propulsion into standing, immediate standing balance is unsteady and inconsistent 10 meter walk test: 10.22 seconds no AD SBA = 0.98 m/sec OR 3.23 ft/sec Functional gait assessment: 19/30  [facility] PT Assessment - 05/08/23 1035       Functional Gait  Assessment   Gait assessed   Yes    Gait Level Surface Walks 20 ft in less than 5.5 sec, no assistive devices, good speed, no evidence for imbalance, normal gait pattern, deviates no more than  6 in outside of the 12 in walkway width.    Change in Gait Speed Able to change speed, demonstrates mild gait deviations, deviates 6-10 in outside of the 12 in walkway width, or no gait deviations, unable to achieve a major change in velocity, or uses a change in velocity, or uses an assistive device.    Gait with Horizontal Head Turns Performs head turns smoothly with no change in gait. Deviates no more than 6 in outside 12 in walkway width    Gait with Vertical Head Turns Performs head turns with no change in gait. Deviates no more than 6 in outside 12 in walkway width.    Gait and Pivot Turn Pivot turns safely within 3 sec and stops quickly with no loss of balance.    Step Over Obstacle Is able to step over one shoe box (4.5 in total height) without changing gait speed. No evidence of imbalance.    Gait with Narrow Base of Support Ambulates 4-7 steps.   6 steps   Gait with Eyes Closed Walks 20 ft, slow speed, abnormal gait pattern, evidence for imbalance, deviates 10-15 in outside 12 in walkway width. Requires more than 9 sec to ambulate 20 ft.    Ambulating Backwards Walks 20 ft, uses assistive device, slower speed, mild gait deviations, deviates 6-10 in outside 12 in walkway width.    Steps Alternating feet, must use rail.    Total Score 22    FGA comment: 22/30 = moderate fall risk              PATIENT SURVEYS:  None completed due to time.  TODAY'S TREATMENT:                                                                                                                              DATE: 05/08/2023  -Verbally reviewed HEP, pt states she continues to feel comfortable with this.  She has current copy. - :  9.10 seconds = 1.1 m/sec OR 3.63 ft/sec no AD IND -FGA:    [facility] PT Assessment - 05/08/23 1035       Functional  Gait  Assessment   Gait assessed  Yes    Gait Level Surface Walks 20 ft in less than 5.5 sec, no assistive devices, good speed, no evidence for imbalance, normal gait pattern, deviates no more than 6 in outside of the 12 in walkway width.    Change in Gait Speed Able to change speed, demonstrates mild gait deviations, deviates 6-10 in outside of the 12 in walkway width, or no gait deviations, unable to achieve a major change in velocity, or uses a change in velocity, or uses an assistive device.    Gait with Horizontal Head Turns Performs head turns smoothly with no change in gait. Deviates no more than 6 in outside 12 in walkway width    Gait with Vertical Head Turns Performs head turns with no change in gait. Deviates no  more than 6 in outside 12 in walkway width.    Gait and Pivot Turn Pivot turns safely within 3 sec and stops quickly with no loss of balance.    Step Over Obstacle Is able to step over one shoe box (4.5 in total height) without changing gait speed. No evidence of imbalance.    Gait with Narrow Base of Support Ambulates 4-7 steps.   6 steps   Gait with Eyes Closed Walks 20 ft, slow speed, abnormal gait pattern, evidence for imbalance, deviates 10-15 in outside 12 in walkway width. Requires more than 9 sec to ambulate 20 ft.    Ambulating Backwards Walks 20 ft, uses assistive device, slower speed, mild gait deviations, deviates 6-10 in outside 12 in walkway width.    Steps Alternating feet, must use rail.    Total Score 22    FGA comment: 22/30 = moderate fall risk            PATIENT EDUCATION: Education details:  Continue HEP and walking regularly to supplement aerobic class.  Progress towards goals and discharge plan for today. Person educated: Patient Education method: Explanation Education comprehension: verbalized understanding  HOME EXERCISE PROGRAM: HEP set for 3 days per week to allow time for patient aerobic class 3 days per week.  Access Code: [email or web address] URL:  [email or web address] Date: 04/24/2023 Prepared by: Camille Bal  Exercises - Standing Tandem Balance with Counter Support  - 1 x daily - 3 x weekly - 1 sets - 3-4 reps - 30 seconds hold - Corner Balance Feet Together With Eyes Closed  - 1 x daily - 3 x weekly - 1 sets - 3-4 reps - 30 seconds hold - Forward Backward Monster Walk with Band at [employer] and Counter Support  - 1 x daily - 3 x weekly - 3 sets - 10 reps - Standing Bilateral Heel Raise on Step  - 1 x daily - 3 x weekly - 3 sets - 10 reps - Walking with Eyes Closed and Counter Support  - 1 x daily - 3 x weekly - 3 sets - 10 reps - Tandem Walking with Counter Support  - 1 x daily - 3 x weekly - 3 sets - 10 reps - Backward Tandem Walking with Counter Support  - 1 x daily - 3 x weekly - 3 sets - 10 reps  GOALS: Goals reviewed with patient? Yes  SHORT TERM GOALS: Target date: 03/23/2023  Pt will be independent and compliant with initial strengthening and balance HEP in order to maintain functional progress and improve mobility. Baseline:  Provided (8/27) Goal status: ONGOING  2.  Patient will demonstrate understanding of fall prevention education. Baseline: Handout provided (8/27) Goal status: ONGOING  LONG TERM GOALS: Target date: 04/20/2023 (update for remaining visits at time of assessment)  Pt will be independent and compliant with advanced initial strengthening and balance HEP in order to maintain functional progress and improve mobility. Baseline: Compliant and IND (9/24) Goal status: MET  2.  Patient will demonstrate floor recovery with no more than SBA in order to improve safe mobility in the event of a fall in the home environment. Baseline:  CGA-MinA; SBA - modI (9/24) Goal status: MET  3.  Patient will demonstrate 5xSTS assessment with consistent steady immediate standing balance on each rep to demonstrate improved functional BLE strength and balance strategies. Baseline: 11.72 seconds w/ BUE  support, mild propulsion into standing, immediate standing balance is unsteady and inconsistent; 12.40 seconds no UE support and  steady on all standing attempts (9/24) Goal status: MET  4.  Pt will demonstrate a gait speed of >/=3.43 feet/sec in order to decrease risk for falls. Baseline: 3.23 ft/sec; 3.95 ft/sec (9/24) Goal status: MET  5.  Pt will improve FGA score to >/=23/30 in order to demonstrate improved balance and decreased fall risk. Baseline: 19/30; 18/30 (9/24) Goal status: NOT MET  LONG TERM GOALS (updated for remaining visits): Target date: 05/08/2023  1.  Pt will demonstrate a gait speed of >/=4.15 feet/sec in order to decrease risk for falls. Baseline: 3.95 ft/sec (9/24); 3.63 ft/sec no AD IND (10/15) Goal status: NOT MET  2.  Pt will improve FGA score to >/=23/30 in order to demonstrate improved balance and decreased fall risk. Baseline: 18/30 (9/24); 22/30 (10/15) Goal status: PARTIALLY MET  ASSESSMENT:  CLINICAL IMPRESSION: Assessed LTGs this visit in preparation for discharge.  She has made great progress with her balance improving her FGA score to 22 from 18/30 prior.  This places her in the moderate vs significant fall risk category.  Her gait speed was some slower today than prior assessment completing distance at a speed of 3.63 ft/sec vs 3.95 ft/sec at independent level.  She has ongoing variance in her gait pattern due to ongoing LE posturing, but is independent with no notable LOB.  Did encourage her to add walking to her day-to-day routine to supplement her aerobic class and HEP to maintain and even progress her activity tolerance.  She remains motivated to continue activity outside of therapy and appears appropriate to discharge today.  She is in agreement to plan.  OBJECTIVE IMPAIRMENTS: Abnormal gait, decreased activity tolerance, decreased balance, decreased knowledge of use of DME, difficulty walking, decreased strength, improper body mechanics, and postural  dysfunction.   ACTIVITY LIMITATIONS: lifting, squatting, stairs, transfers, and locomotion level  PARTICIPATION LIMITATIONS: community activity and yard work  PERSONAL FACTORS: Age, Fitness, Past/current experiences, Time since onset of injury/illness/exacerbation, and 1 comorbidity: prior surgical changes  are also affecting patient's functional outcome.   REHAB POTENTIAL: Good  CLINICAL DECISION MAKING: Stable/uncomplicated  EVALUATION COMPLEXITY: Low  PLAN:  PT FREQUENCY: 1x/week  PT DURATION: 12 weeks (only scheduling 8 visits to start)  PLANNED INTERVENTIONS: Therapeutic exercises, Therapeutic activity, Neuromuscular re-education, Balance training, Gait training, Patient/Family education, Self Care, Joint mobilization, Stair training, Vestibular training, Orthotic/Fit training, DME instructions, Taping, Manual therapy, and Re-evaluation  PLAN FOR NEXT SESSION:  N/A  Sadie Haber, PT, DPT 05/08/2023, 10:55 AM

## 2024-02-19 ENCOUNTER — Other Ambulatory Visit: Payer: Self-pay | Admitting: Obstetrics and Gynecology

## 2024-02-19 DIAGNOSIS — Z1231 Encounter for screening mammogram for malignant neoplasm of breast: Secondary | ICD-10-CM

## 2024-03-06 ENCOUNTER — Ambulatory Visit
Admission: RE | Admit: 2024-03-06 | Discharge: 2024-03-06 | Disposition: A | Discharge status: Home / Self Care | Source: Ambulatory Visit | Attending: Obstetrics and Gynecology | Admitting: Obstetrics and Gynecology

## 2024-03-06 DIAGNOSIS — Z1231 Encounter for screening mammogram for malignant neoplasm of breast: Secondary | ICD-10-CM | POA: Diagnosis present

## 2024-08-28 ENCOUNTER — Other Ambulatory Visit: Payer: Self-pay

## 2024-08-28 ENCOUNTER — Emergency Department

## 2024-08-28 NOTE — ED Triage Notes (Signed)
 Pt presents for mechanical fall while dancing. Endorsing head strike without LOC. Denies pain or injury. Not on blood thinning medication.

## 2024-08-29 ENCOUNTER — Emergency Department
Admission: EM | Admit: 2024-08-29 | Discharge: 2024-08-29 | Disposition: A | Source: Home / Self Care | Attending: Emergency Medicine | Admitting: Emergency Medicine

## 2024-08-29 DIAGNOSIS — W19XXXA Unspecified fall, initial encounter: Secondary | ICD-10-CM

## 2024-08-29 DIAGNOSIS — S0181XA Laceration without foreign body of other part of head, initial encounter: Secondary | ICD-10-CM

## 2024-08-29 DIAGNOSIS — S0083XA Contusion of other part of head, initial encounter: Secondary | ICD-10-CM

## 2024-08-29 NOTE — Discharge Instructions (Signed)
 You have been seen in the Emergency Department (ED) today for a fall.  Your work up does not show any concerning injuries.  Please take over-the-counter ibuprofen and/or Tylenol as needed for your pain (unless you have an allergy or your doctor as told you not to take them), or take any prescribed medication as instructed.  The laceration on your forehead should heal well with the derma clip in place.  It should fall off on its own within a few days and possibly a week.  Do not apply any Vaseline or bacitracin to the tape because it will cause it to come off sooner.  If it does fall off early, the laceration will heal on its own.  Please follow up with your doctor regarding today's Emergency Department (ED) visit and your recent fall.    Return to the ED if you have any headache, confusion, slurred speech, weakness/numbness of any arm or leg, or any increased pain.

## 2024-08-29 NOTE — ED Provider Notes (Signed)
 "  [facility] Provider Note    Event Date/Time   First MD Initiated Contact with Patient 08/29/24 0036     (approximate)   History   Fall   HPI Ann Castro is a 74 y.o. female who presents for evaluation of a fall with contusion to her forehead.  She was line dancing and somehow tripped and fell over, striking her left side of her forehead on the ground.  She did not lose consciousness and has no pain in her head or her neck.  She said her face does not even hurt even though it is swollen and has a small laceration.  No visual changes.  No chest pain or shortness of breath.  She takes a baby aspirin daily but no other blood thinners.     Physical Exam   Triage Vital Signs: ED Triage Vitals  Encounter Vitals Group     BP 08/28/24 2150 (!) 162/81     Girls Systolic BP Percentile --      Girls Diastolic BP Percentile --      Boys Systolic BP Percentile --      Boys Diastolic BP Percentile --      Pulse Rate 08/28/24 2150 77     Resp 08/28/24 2150 18     Temp 08/28/24 2150 97.8 F (36.6 C)     Temp Source 08/28/24 2150 Oral     SpO2 08/28/24 2150 97 %     Weight 08/28/24 2149 48.5 kg (107 lb)     Height 08/28/24 2149 1.549 m (5' 1)     Head Circumference --      Peak Flow --      Pain Score 08/28/24 2149 0     Pain Loc --      Pain Education --      Exclude from Growth Chart --     Most recent vital signs: Vitals:   08/28/24 2150 08/29/24 0136  BP: (!) 162/81 (!) 166/96  Pulse: 77 68  Resp: 18 15  Temp: 97.8 F (36.6 C)   SpO2: 97% 99%    General: Awake, no distress.  Head/Face: Hematoma and ecchymosis to the left side of her forehead and left-sided black eye.  No pupil changes, no globe injuries.  Small 0.8 cm laceration at the center of the hematoma on the left side of her forehead. CV:  Good peripheral perfusion.  Resp:  Normal effort. Speaking easily and comfortably, no accessory muscle usage nor intercostal retractions.    Abd:  No distention.  Other:  No tenderness to palpation of the cervical spine and no pain or tenderness with flexion, extension, and rotation of the head neck from side-to-side   ED Results / Procedures / Treatments   Labs (all labs ordered are listed, but only abnormal results are displayed) Labs Reviewed - No data to display   RADIOLOGY I independently viewed and interpret the patient's CT head and CT cervical spine.  There is no indication of intracranial hemorrhage, cervical spine injury, nor skull fracture.  I also read the radiologist's report, which confirmed no acute findings.   PROCEDURES:  Critical Care performed: No  .Laceration Repair  Date/Time: 08/29/2024 2:07 AM  Performed by: Gordan Huxley, MD Authorized by: Gordan Huxley, MD   Consent:    Consent obtained:  Verbal   Consent given by:  Patient   Risks discussed:  Infection, pain, poor cosmetic result and poor wound healing Universal protocol:    Patient  identity confirmed:  Verbally with patient and arm band Anesthesia:    Anesthesia method:  None Laceration details:    Length (cm):  0.8 Treatment:    Amount of cleaning:  Standard Skin repair:    Repair method: 1 regular Derma-Clip. Approximation:    Approximation:  Close Repair type:    Repair type:  Simple Post-procedure details:    Dressing:  Open (no dressing)   Procedure completion:  Tolerated well, no immediate complications     IMPRESSION / MDM / ASSESSMENT AND PLAN / ED COURSE  I reviewed the triage vital signs and the nursing notes.                              Differential diagnosis includes, but is not limited to, contusion, fracture, dislocation, intracranial bleed, neck injury.  Patient's presentation is most consistent with acute presentation with potential threat to life or bodily function.  Labs/studies ordered (see ED course for additional labs and studies that may have been added later): CT head and CT cervical  spine  Interventions/Medications given:  Medications - No data to display  (Note:  hospital course my include additional interventions and/or labs/studies not listed above.)   Well-appearing, no worrisome findings on imaging nor physical exam.  Derma clip repair of small forehead laceration.  Patient is ambulatory and showing no signs of emergent injury requiring additional observation.  The patient's medical screening exam is reassuring with no indication of an emergent medical condition requiring hospitalization or additional evaluation at this point.  The patient is safe and appropriate for discharge and outpatient follow up.       FINAL CLINICAL IMPRESSION(S) / ED DIAGNOSES   Final diagnoses:  Fall, initial encounter  Contusion of face, initial encounter  Laceration of forehead, initial encounter     Rx / DC Orders   ED Discharge Orders     None        Note:  This document was prepared using Dragon voice recognition software and may include unintentional dictation errors.   Gordan Huxley, MD 08/29/24 0210  "
